# Patient Record
Sex: Female | Born: 2000 | Hispanic: No | Marital: Single | Smoking: Never smoker
Health system: Southern US, Community
[De-identification: ages and names within clinical notes are randomized; demographics above are authoritative.]

## PROBLEM LIST (undated history)

## (undated) DIAGNOSIS — G5603 Carpal tunnel syndrome, bilateral upper limbs: Secondary | ICD-10-CM

## (undated) DIAGNOSIS — J45909 Unspecified asthma, uncomplicated: Secondary | ICD-10-CM

## (undated) DIAGNOSIS — M93003 Unspecified slipped upper femoral epiphysis (nontraumatic), unspecified hip: Secondary | ICD-10-CM

## (undated) DIAGNOSIS — T7840XA Allergy, unspecified, initial encounter: Secondary | ICD-10-CM

## (undated) DIAGNOSIS — K219 Gastro-esophageal reflux disease without esophagitis: Secondary | ICD-10-CM

## (undated) DIAGNOSIS — E663 Overweight: Secondary | ICD-10-CM

## (undated) HISTORY — DX: Overweight: E66.3

## (undated) HISTORY — DX: Unspecified slipped upper femoral epiphysis (nontraumatic), unspecified hip: M93.003

## (undated) HISTORY — DX: Allergy, unspecified, initial encounter: T78.40XA

---
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
## 2018-01-13 ENCOUNTER — Encounter

## 2018-01-13 ENCOUNTER — Ambulatory Visit: Payer: Medicaid Other | Admitting: Diagnostic Neuroimaging

## 2018-01-14 ENCOUNTER — Encounter: Payer: Self-pay | Admitting: Diagnostic Neuroimaging

## 2018-03-02 ENCOUNTER — Other Ambulatory Visit: Payer: Self-pay | Admitting: Obstetrics

## 2018-03-02 DIAGNOSIS — Z3009 Encounter for other general counseling and advice on contraception: Secondary | ICD-10-CM

## 2018-03-03 ENCOUNTER — Other Ambulatory Visit: Payer: Self-pay | Admitting: Obstetrics

## 2018-03-03 DIAGNOSIS — Z30011 Encounter for initial prescription of contraceptive pills: Secondary | ICD-10-CM

## 2018-06-12 ENCOUNTER — Emergency Department (HOSPITAL_COMMUNITY)
Admission: EM | Admit: 2018-06-12 | Discharge: 2018-06-13 | Disposition: A | Discharge status: Home / Self Care | Payer: Medicaid Other | Attending: Emergency Medicine | Admitting: Emergency Medicine

## 2018-06-12 ENCOUNTER — Other Ambulatory Visit: Payer: Self-pay

## 2018-06-12 DIAGNOSIS — R200 Anesthesia of skin: Secondary | ICD-10-CM | POA: Insufficient documentation

## 2018-06-12 DIAGNOSIS — Z79899 Other long term (current) drug therapy: Secondary | ICD-10-CM | POA: Insufficient documentation

## 2018-06-12 DIAGNOSIS — R202 Paresthesia of skin: Secondary | ICD-10-CM

## 2018-06-12 DIAGNOSIS — R0789 Other chest pain: Secondary | ICD-10-CM | POA: Insufficient documentation

## 2018-06-12 NOTE — ED Provider Notes (Signed)
[facility]-EMERGENCY DEPT Provider Note   CSN: [id number] Arrival date & time: 06/12/18  1810     History   Chief Complaint Chief Complaint  Patient presents with  . Numbness  . Tingling  . Chest Pain    HPI Olivia Vasquez is a 17 y.o. female past medical history significant for paresthesias who presents for evaluation of abnormal sensation to her body.  Patient states she has had this sensation for over 1.5 years.  States she has been seen by neurology Dr. Marjory Lies with [facility] and received an MRI with negative results.  Patient states she did not follow-up with neurology for continued symptoms.  Seen by her primary care provider for this issue multiple times the past year without a cause of her symptoms.  Patient states she is frustrated that no one can tell her why she has these abnormal sensations. Denies fever, chills, headache, nausea, vomiting, chest pain, shortness of breath, abdominal pain, dysuria, numbness.  Denies any known trauma to head or neck.  Denies neck decreased range of motion or stiffness.  Patient states she had an episode of pain to her collar bone when she reach over her head this morning, which she calls "chest pain." Patient states that "Im here to get an MRI of my head and back for my issues." Patient was seen by [facility] yesterday with labs and imaging performed. Patient states "they told me they didn't find anything." Patient states she only feels the abnormal sensation when she touches her skin, not when providers do an exam.  HPI  Past Medical History:  Diagnosis Date  . Allergy   . Asthma   . Overweight child 11/12/2011   BMI 95%  . 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 GERD (gastroesophageal reflux disease) 12/12/2011  . Overweight child 11/12/2011  . Allergic rhinitis 05/22/2011  .  Asthma, mild intermittent 05/22/2011  . 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 SLIPPED CAPITAL FEMORAL EPIPHYSIS PINNING  06/2008   Pinned at [facility]     OB History   None      Home Medications    Prior to Admission medications   Medication Sig Start Date End Date Taking? Authorizing Provider  BLISOVI 24 FE 1-20 MG-MCG(24) tablet TAKE 1 TABLET BY MOUTH EVERY DAY 03/04/18  Yes Brock Bad, MD  ibuprofen (ADVIL,MOTRIN) 800 MG tablet Take 1 tablet (800 mg total) by mouth every 8 (eight) hours as needed for headache, mild pain or moderate pain. 05/02/17  Yes Brock Bad, MD  Prenat-FeCbn-FeAsp-Meth-FA-DHA (PRENATE MINI) 18-0.6-0.4-350 MG CAPS TAKE 1 CAPSULE BY MOUTH AT BEDTIME. 03/03/18  Yes Brock Bad, MD    Family History Family History  Problem Relation Age of Onset  . Diabetes Paternal Grandmother   . Hypertension Paternal Grandmother   . Kidney disease Maternal Uncle        cancer  . Kidney disease Maternal Grandmother        cancer  . Hypertension Maternal Grandmother   . Thyroid disease Maternal Grandmother   . Cancer Maternal Grandfather        fatty liver.    Social History Social History   Tobacco Use  . Smoking status: Never Smoker  . Smokeless tobacco: Never Used  Substance Use Topics  .  Alcohol use: No    Alcohol/week: 0.0 standard drinks  . Drug use: No     Allergies   Patient has no known allergies.   Review of Systems Review of Systems  Constitutional: Negative.   Respiratory: Negative.   Cardiovascular: Negative.   Gastrointestinal: Negative.   Genitourinary: Negative.   Musculoskeletal: Negative.  Negative for back pain, myalgias, neck pain and neck stiffness.  Skin: Negative.   Neurological: Negative for dizziness, tremors, syncope, facial asymmetry, speech difficulty, weakness, light-headedness and headaches.       "Abnormal sensation to entire body"  All other  systems reviewed and are negative.    Physical Exam Updated Vital Signs BP 134/76 (BP Location: Left Arm)   Pulse 69   Temp 97.9 F (36.6 C) (Oral)   Resp 19   Ht 5' 3.5" (1.613 m)   Wt 88.9 kg   LMP 05/24/2018   SpO2 98%   BMI 34.18 kg/m   Physical Exam  Physical Exam  Constitutional: Pt appears well-developed and well-nourished. No distress.  HENT:  Head: Normocephalic and atraumatic.  Mouth/Throat: Oropharynx is clear and moist. No oropharyngeal exudate.  Eyes: Conjunctivae are normal.  Neck: Normal range of motion. Neck supple.  Full ROM without pain.  No neck rigidity. Cardiovascular: Normal rate, regular rhythm and intact distal pulses.   Pulmonary/Chest: Effort normal and breath sounds normal. No respiratory distress. Pt has no wheezes.  Abdominal: Soft. Pt exhibits no distension. There is no tenderness, rebound or guarding. No abd bruit or pulsatile mass Musculoskeletal:  Full range of motion of the T-spine and L-spine with flexion, hyperextension, and lateral flexion. No midline tenderness or stepoffs. No tenderness to palpation of the spinous processes of the T-spine or L-spine. No tenderness to palpation of the paraspinous muscles of the L-spine. Lymphadenopathy:    Pt has no cervical adenopathy.  Neurological: Pt is alert. Pt has normal reflexes.  Reflex Scores:      Bicep reflexes are 2+ on the right side and 2+ on the left side.      Brachioradialis reflexes are 2+ on the right side and 2+ on the left side.      Patellar reflexes are 2+ on the right side and 2+ on the left side.      Achilles reflexes are 2+ on the right side and 2+ on the left side. Speech is clear and goal oriented, follows commands Normal 5/5 strength in upper and lower extremities bilaterally including dorsiflexion and plantar flexion, strong and equal grip strength Sensation normal to light and sharp touch. Patient states it only "feels weird when I touch my skin." Moves extremities  without ataxia, coordination intact Normal gait Normal balance No Clonus Skin: Skin is warm and dry. No rash noted or lesions noted. Pt is not diaphoretic. No erythema, ecchymosis,edema or warmth.  Psychiatric: Pt has a normal mood and affect. Behavior is normal.  Nursing note and vitals reviewed.  ED Treatments / Results  Labs (all labs ordered are listed, but only abnormal results are displayed) Labs Reviewed  CBC WITH DIFFERENTIAL/PLATELET - Abnormal; Notable for the following components:      Result Value   Lymphs Abs 0.5 (*)    All other components within normal limits  COMPREHENSIVE METABOLIC PANEL  MAGNESIUM    EKG EKG Interpretation  Date/Time:  Thursday June 12 2018 19:03:22 EDT Ventricular Rate:  81 PR Interval:    QRS Duration: 91 QT Interval:  356 QTC Calculation: 414 R Axis:  79 Text Interpretation:  Sinus rhythm Baseline wander in lead(s) V4 Confirmed by Tilden Fossa [phone]) on 06/13/2018 12:05:35 AM   Radiology No results found.  Procedures Procedures (including critical care time)  Medications Ordered in ED Medications - No data to display   Initial Impression / Assessment and Plan / ED Course  I have reviewed the triage vital signs and the nursing notes.  Pertinent labs & imaging results that were available during my care of the patient were reviewed by me and considered in my medical decision making (see chart for details).  17 year old female who appears otherwise well presents for evaluation of "weird sensation to my entire body." Has been occurring for over 1.5 years. Has been seen by Neurology and her PCP multiple times for this issue. Has a negative MRI brain in 2018 for this issue. Patient was supposed to follow up for nerve conduction studies, which she did not follow -up with.  Patient states initial triage exam that she has chest pain, however when discussing with patient she points to her right collarbone and states that she had pain  right here in her chest when she reached above her head earlier this morning. EKG normal sinus rhythm. Denies current pain.  There has been no trauma to this area.  Low suspicion for ACS, PERC negative.  Patient is adamant that " I need my from my head and my entire body for my issues."  Discussed with patient that she does not need an emergent MRI at this time.  Patient is upset stating that she has "already waited and I expect scans." I was not able to reproduce her paresthesia on exam. Discussed with patient we can obtain lab work to r/o electrolyte or metabolic abnormalities that would be causing her symptoms. Given her length of symptoms and multiple ED and outpatient visits with negative imaging for her recurrent symptoms doubt emergent pathology. Discussed follow-up with her Neurologist for her recurrent symptoms.  Care of patient was transferred to [facility], PA-C at shift change. She will determine ultimate disposition of patient.    Final Clinical Impressions(s) / ED Diagnoses   Final diagnoses:  None    ED Discharge Orders    None       Teyona Nichelson A, PA-C 06/13/18 0049    Tilden Fossa, MD 06/15/18 [phone]

## 2018-06-12 NOTE — ED Triage Notes (Addendum)
Patient arrives with c/o numbness and tingling over whole body. Patient states she has been by Neurology with no explanation. Patient reports tingling and numbness occurs when reaching above head. Patient reports chest tightness. Patient seen at [facility] medical and had xrays and blood taken but was unable to get results.

## 2018-06-13 LAB — CBC WITH DIFFERENTIAL/PLATELET
Abs Immature Granulocytes: 0.01 10*3/uL (ref 0.00–0.07)
BASOS PCT: 0 %
Basophils Absolute: 0 10*3/uL (ref 0.0–0.1)
EOS ABS: 0.1 10*3/uL (ref 0.0–0.5)
Eosinophils Relative: 1 %
HCT: 42.1 % (ref 36.0–46.0)
Hemoglobin: 13.4 g/dL (ref 12.0–15.0)
IMMATURE GRANULOCYTES: 0 %
Lymphocytes Relative: 7 %
Lymphs Abs: 0.5 10*3/uL — ABNORMAL LOW (ref 0.7–4.0)
MCH: 30.6 pg (ref 26.0–34.0)
MCHC: 31.8 g/dL (ref 30.0–36.0)
MCV: 96.1 fL (ref 80.0–100.0)
MONO ABS: 0.8 10*3/uL (ref 0.1–1.0)
Monocytes Relative: 11 %
NEUTROS PCT: 81 %
Neutro Abs: 6 10*3/uL (ref 1.7–7.7)
PLATELETS: 337 10*3/uL (ref 150–400)
RBC: 4.38 MIL/uL (ref 3.87–5.11)
RDW: 12.7 % (ref 11.5–15.5)
WBC: 7.5 10*3/uL (ref 4.0–10.5)
nRBC: 0 % (ref 0.0–0.2)

## 2018-06-13 LAB — COMPREHENSIVE METABOLIC PANEL
ALT: 16 U/L (ref 0–44)
AST: 16 U/L (ref 15–41)
Albumin: 3.7 g/dL (ref 3.5–5.0)
Alkaline Phosphatase: 64 U/L (ref 38–126)
Anion gap: 8 (ref 5–15)
BUN: 11 mg/dL (ref 6–20)
CHLORIDE: 107 mmol/L (ref 98–111)
CO2: 25 mmol/L (ref 22–32)
CREATININE: 0.74 mg/dL (ref 0.44–1.00)
Calcium: 9.3 mg/dL (ref 8.9–10.3)
GFR calc Af Amer: >60 mL/min (ref >60)
GFR calc non Af Amer: >60 mL/min (ref >60)
Glucose, Bld: 86 mg/dL (ref 70–99)
POTASSIUM: 3.5 mmol/L (ref 3.5–5.1)
Sodium: 140 mmol/L (ref 135–145)
Total Bilirubin: 0.1 mg/dL — ABNORMAL LOW (ref 0.3–1.2)
Total Protein: 7.5 g/dL (ref 6.5–8.1)

## 2018-06-13 LAB — MAGNESIUM: Magnesium: 2.2 mg/dL (ref 1.7–2.4)

## 2018-06-13 NOTE — ED Provider Notes (Signed)
Patient signed out to me by the [place], PA-C.  Please see previous notes for further history.  In brief, patient presenting for evaluation of 1-1/2-year history of abnormal sensations to the body.  She has been seen by both neurology and orthopedics for this.  She is here today requesting an MRI.  No neurologic deficits noted on exam.  Plan to obtain basic labs and have patient follow-up with [facility] neurology if labs are normal.  Labs reassuring, no leukocytosis.  Electrolytes stable.  Hemoglobin stable.  Kidney, liver function normal.  Discussed findings with patient.  Discussed follow-up with neurology for further evaluation.  At this time, patient appears safe for discharge.  Return precautions given.  Patient states she understands.    Olivia Apley, PA-C 06/13/18 Danae Orleans, MD 06/13/18 [phone]

## 2018-06-13 NOTE — Discharge Instructions (Addendum)
Follow-up with your neurologist about your symptoms. Return to the emergency room with any new, worsening, or concerning symptoms.

## 2018-06-16 ENCOUNTER — Encounter: Payer: Self-pay | Admitting: Diagnostic Neuroimaging

## 2018-06-16 ENCOUNTER — Ambulatory Visit: Payer: Medicaid Other | Admitting: Diagnostic Neuroimaging

## 2018-06-16 VITALS — BP 127/71 | HR 78 | Ht 63.5 in | Wt 207.0 lb

## 2018-06-16 DIAGNOSIS — R200 Anesthesia of skin: Secondary | ICD-10-CM

## 2018-06-16 DIAGNOSIS — M542 Cervicalgia: Secondary | ICD-10-CM

## 2018-06-16 DIAGNOSIS — R202 Paresthesia of skin: Secondary | ICD-10-CM | POA: Diagnosis not present

## 2018-06-16 NOTE — Patient Instructions (Signed)
-   check MRI cervical spine   - check EMG/NCS (electrical nerve testing)  - referral placed for PT / OT

## 2018-06-16 NOTE — Progress Notes (Signed)
[facility]  PATIENT: Olivia Vasquez DOB: 20-Nov-2000  REFERRING CLINICIAN: S Gosrani HISTORY FROM: patient  REASON FOR VISIT: follow up   HISTORICAL  CHIEF COMPLAINT:  Chief Complaint  Patient presents with  . Numbness    rm 7, friend- DJ, "saw PCP, Dr Malen Gauze last Wed, had labs and xray of neck, spine done- didn't receive results"  . Follow-up    HISTORY OF PRESENT ILLNESS:   UPDATE (06/16/18, VRP): Since last visit, doing about the same. Symptoms are worsening, with movements such as hands out-stretch and holding backpack. Severity is mild-mdoerate. No alleviating or aggravating factors.    PRIOR HPI (03/26/17): 17 year old female here for evaluation of numbness and tingling. January 2018 patient 2 weeks episode of numbness and tingling in her hands and feet. This spontaneous resolved. In March 2018 patient had similar episode of numbness in her hands and feet. This lasts for 1 week. In June 2018 patient had recurrence of similar symptoms. Since that time patient having migratory transient numbness and tingling in her hands, arms, feet and legs. She denies any significant symptoms in her chest or abdomen. No problems with her face. No vision changes, dizziness, slurred speech.  No specific triggering or aggravating factors. No change in sleep, stress, diet or exercise. No recent traumas or accidents.   REVIEW OF SYSTEMS: Full 14 system review of systems performed and negative with exception of: numbness weakness SOB runny nose.  ALLERGIES: No Known Allergies  HOME MEDICATIONS: Outpatient Medications Prior to Visit  Medication Sig Dispense Refill  . BLISOVI 24 FE 1-20 MG-MCG(24) tablet TAKE 1 TABLET BY MOUTH EVERY DAY 28 tablet 11  . ibuprofen (ADVIL,MOTRIN) 800 MG tablet Take 1 tablet (800 mg total) by mouth every 8 (eight) hours as needed for headache, mild pain or moderate pain. 30 tablet 6  . Prenat-FeCbn-FeAsp-Meth-FA-DHA (PRENATE MINI)  18-0.6-0.4-350 MG CAPS TAKE 1 CAPSULE BY MOUTH AT BEDTIME. 30 capsule 11   No facility-administered medications prior to visit.     PAST MEDICAL HISTORY: Past Medical History:  Diagnosis Date  . Allergy   . Asthma   . Overweight child 11/12/2011   BMI 95%  . 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 SLIPPED CAPITAL FEMORAL EPIPHYSIS PINNING  06/2008   Pinned at [facility]    FAMILY HISTORY: Family History  Problem Relation Age of Onset  . Diabetes Paternal Grandmother   . Hypertension Paternal Grandmother   . Kidney disease Maternal Uncle        cancer  . Kidney disease Maternal Grandmother        cancer  . Hypertension Maternal Grandmother   . Thyroid disease Maternal Grandmother   . Cancer Maternal Grandfather        fatty liver.    SOCIAL HISTORY:  Social History   Socioeconomic History  . Marital status: Single    Spouse name: Not on file  . Number of children: Not on file  . Years of education: Not on file  . Highest education level: Not on file  Occupational History  . Not on file  Social Needs  . Financial resource strain: Not on file  . Food insecurity:    Worry: Not on file    Inability: Not on file  . Transportation needs:    Medical: Not on file    Non-medical: Not on  file  Tobacco Use  . Smoking status: Never Smoker  . Smokeless tobacco: Never Used  Substance and Sexual Activity  . Alcohol use: No    Alcohol/week: 0.0 standard drinks  . Drug use: No  . Sexual activity: Never  Lifestyle  . Physical activity:    Days per week: Not on file    Minutes per session: Not on file  . Stress: Not on file  Relationships  . Social connections:    Talks on phone: Not on file    Gets together: Not on file    Attends religious service: Not on file    Active member of club or organization: Not on file    Attends  meetings of clubs or organizations: Not on file    Relationship status: Not on file  . Intimate partner violence:    Fear of current or ex partner: Not on file    Emotionally abused: Not on file    Physically abused: Not on file    Forced sexual activity: Not on file  Other Topics Concern  . Not on file  Social History Narrative   Lives at home with grandmother. .  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       PHYSICAL EXAM  GENERAL EXAM/CONSTITUTIONAL: Vitals:  Vitals:   06/16/18 1518  BP: 127/71  Pulse: 78  Weight: 207 lb (93.9 kg)  Height: 5' 3.5" (1.613 m)   Body mass index is 36.09 kg/m. No exam data present  Patient is in no distress; well developed, nourished and groomed; neck is supple  CARDIOVASCULAR:  Examination of carotid arteries is normal; no carotid bruits  Regular rate and rhythm, no murmurs  Examination of peripheral vascular system by observation and palpation is normal  EYES:  Ophthalmoscopic exam of optic discs and posterior segments is normal; no papilledema or hemorrhages  MUSCULOSKELETAL:  Gait, strength, tone, movements noted in Neurologic exam below  NEUROLOGIC: MENTAL STATUS:  No flowsheet data found.  awake, alert, oriented to person, place and time  recent and remote memory intact  normal attention and concentration  language fluent, comprehension intact, naming intact,   fund of knowledge appropriate  CRANIAL NERVE:   2nd - no papilledema on fundoscopic exam  2nd, 3rd, 4th, 6th - pupils equal and reactive to light, visual fields full to confrontation, extraocular muscles intact, no nystagmus  5th - facial sensation symmetric  7th - facial strength symmetric  8th - hearing intact  9th - palate elevates symmetrically, uvula midline  11th - shoulder shrug symmetric  12th - tongue protrusion midline  MOTOR:   normal bulk and tone, full strength in the BUE, BLE  SENSORY:   normal and symmetric to light touch,  temperature, vibration  COORDINATION:   finger-nose-finger, fine finger movements normal  REFLEXES:   deep tendon reflexes present and symmetric  GAIT/STATION:   narrow based gait    DIAGNOSTIC DATA (LABS, IMAGING, TESTING) - I reviewed patient records, labs, notes, testing and imaging myself where available.  Lab Results  Component Value Date   WBC 7.5 06/12/2018   HGB 13.4 06/12/2018   HCT 42.1 06/12/2018   MCV 96.1 06/12/2018   PLT 337 06/12/2018      Component Value Date/Time   NA 140 06/12/2018 2310   K 3.5 06/12/2018 2310   CL 107 06/12/2018 2310   CO2 25 06/12/2018 2310   GLUCOSE 86 06/12/2018 2310   BUN 11 06/12/2018 2310   CREATININE 0.74 06/12/2018 2310  CREATININE 0.54 09/03/2011 1446   CALCIUM 9.3 06/12/2018 2310   PROT 7.5 06/12/2018 2310   ALBUMIN 3.7 06/12/2018 2310   AST 16 06/12/2018 2310   ALT 16 06/12/2018 2310   ALKPHOS 64 06/12/2018 2310   BILITOT 0.1 (L) 06/12/2018 2310   GFRNONAA >60 06/12/2018 2310   GFRAA >60 06/12/2018 2310   No results found for: CHOL, HDL, LDLCALC, LDLDIRECT, TRIG, CHOLHDL Lab Results  Component Value Date   HGBA1C 5.5 09/03/2011   Lab Results  Component Value Date   VITAMINB12 515 03/26/2017   Lab Results  Component Value Date   TSH 2.247 09/03/2011    02/12/03 CT head  - NO EVIDENCE OF INTRACRANIAL MASS OR HEMORRHAGE.  NO SKULL FRACTURE OR FOREIGN BODY.  04/05/17 MRI brain [I reviewed images myself and agree with interpretation. -VRP]  1.   The brain parenchyma appears normal. 2.   The sella turcica is mildly enlarged but the pituitary has a normal contour.   Since the pituitary tissue has normal height, this is unlikely to be clinically significant though early changes of elevated intracranial pressure cannot be ruled out.    ASSESSMENT AND PLAN  17 y.o. year old female here with transient numbness and tingling in arms and legs. Symptoms are significantly bothering patient. Symptoms lasting up to 2  weeks at a time.   Ddx transient numbness and tingling: peripheral neuropathy, intermittent nerve compression, benign paresthesias  1. Hand numbness   2. Cervicalgia   3. Numbness and tingling      PLAN:  - check MRI cervical spine to eval hand /foot numbness (eval for demyelinating dz) - check EMG/NCS (eval for neuropathy; CTS) - referral placed for PT / OT  Orders Placed This Encounter  Procedures  . MR CERVICAL SPINE W WO CONTRAST  . Ambulatory referral to Physical Therapy  . Ambulatory referral to Occupational Therapy  . NCV with EMG(electromyography)   Return for for NCV/EMG.    Suanne Marker, MD 06/16/2018, 3:26 PM Certified in Neurology, Neurophysiology and Neuroimaging  [facility] [address] [place], [place] [address] [phone]

## 2018-06-17 ENCOUNTER — Telehealth: Payer: Self-pay | Admitting: Diagnostic Neuroimaging

## 2018-06-17 NOTE — Telephone Encounter (Signed)
Spoke to patient she is aware of this and to call [facility]  If she has not heard in the next 2-3 business days. She stated she has their number.

## 2018-06-17 NOTE — Telephone Encounter (Signed)
Medicaid order sent to [facility]. They obtain the auth and will reach out to the pt to schedule.  °

## 2018-07-31 ENCOUNTER — Encounter

## 2018-07-31 ENCOUNTER — Ambulatory Visit (INDEPENDENT_AMBULATORY_CARE_PROVIDER_SITE_OTHER): Payer: Medicaid Other | Admitting: Diagnostic Neuroimaging

## 2018-07-31 ENCOUNTER — Encounter (INDEPENDENT_AMBULATORY_CARE_PROVIDER_SITE_OTHER): Payer: Medicaid Other | Admitting: Diagnostic Neuroimaging

## 2018-07-31 DIAGNOSIS — Z0289 Encounter for other administrative examinations: Secondary | ICD-10-CM

## 2018-07-31 DIAGNOSIS — R200 Anesthesia of skin: Secondary | ICD-10-CM | POA: Diagnosis not present

## 2018-08-04 ENCOUNTER — Other Ambulatory Visit: Payer: Self-pay | Admitting: Obstetrics

## 2018-08-04 DIAGNOSIS — N946 Dysmenorrhea, unspecified: Secondary | ICD-10-CM

## 2018-08-05 NOTE — Procedures (Signed)
[facility]  NCS (NERVE CONDUCTION STUDY) WITH EMG (ELECTROMYOGRAPHY) REPORT   STUDY DATE: 07/31/18 PATIENT NAME: Olivia Vasquez DOB: 18-May-2001 MRN: [id number]  ORDERING CLINICIAN: Joycelyn Schmid, MD   TECHNOLOGIST: Charlesetta Ivory ELECTROMYOGRAPHER: Glenford Bayley. Penumalli, MD  CLINICAL INFORMATION: 17 year old female with hand numbness.  FINDINGS: NERVE CONDUCTION STUDY: Bilateral median, right ulnar, right peroneal and right tibial motor responses are normal.  Bilateral median to ulnar transcarpal comparison studies show prolonged peak latency differences.   Right median sensory response is prolonged peak latency and decreased amplitude.  Median sensory response has prolonged peak latency and normal amplitude.  Bilateral ulnar sensory responses normal.   NEEDLE ELECTROMYOGRAPHY:  Right deltoid, biceps, triceps, flexor carpi radialis, first dorsal interosseous muscles are normal.    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    INTERPRETING PHYSICIAN:  Suanne Marker, MD Certified in Neurology, Neurophysiology and Neuroimaging  [facility] [address], [address] [place], [place] [address] [phone]   [facility]    Nerve / Sites Muscle Latency Ref. Amplitude Ref. Rel Amp Segments Distance Velocity Ref. Area    ms ms mV mV %  cm m/s m/s mVms  R Median - APB     Wrist APB 4.1 ?4.4 8.4 ?4.0 100 Wrist - APB 7   31.2     Upper arm APB 7.9  8.3  99.1 Upper arm - Wrist 22 58 ?49 30.3  L Median - APB     Wrist APB 4.0 ?4.4 8.6 ?4.0 100 Wrist - APB 7   28.1     Upper arm APB 7.6  8.1  94.8 Upper arm - Wrist 22 61 ?49 27.0  R Ulnar - ADM     Wrist ADM 2.6 ?3.3 9.2 ?6.0 100 Wrist - ADM 7   22.4     B.Elbow ADM 5.2  8.7  94.8 B.Elbow - Wrist 18 69 ?49 22.4     A.Elbow ADM 6.7  8.6  99.1 A.Elbow - B.Elbow 10 66 ?49 22.5   A.Elbow - Wrist      R Peroneal - EDB     Ankle EDB 4.2 ?6.5 7.3 ?2.0 100 Ankle - EDB 9   21.8     Fib head EDB 10.0  6.7  91.8 Fib head - Ankle 32 55 ?44 20.2     Pop fossa EDB 11.9  5.9  88.2 Pop fossa - Fib head 10 53 ?44 17.7         Pop fossa - Ankle      R Tibial - AH     Ankle AH 3.8 ?5.8 12.8 ?4.0 100 Ankle - AH 9   22.7     Pop fossa AH 11.5  11.1  86.1 Pop fossa - Ankle 36 46 ?41 22.2               SNC    Nerve / Sites Rec. Site Peak Lat Ref.  Amp Ref. Segments Distance Peak Diff Ref.    ms ms V V  cm ms ms  R Sural - Ankle (Calf)     Calf Ankle 3.9 ?4.4 8 ?6 Calf - Ankle 14    R Superficial peroneal - Ankle     Lat leg Ankle 3.4 ?4.4 13 ?6 Lat leg - Ankle 14    R Median, Ulnar - Transcarpal comparison     Median Palm Wrist  3.0 ?2.2 14 ?35 Median Palm - Wrist 8       Ulnar Palm Wrist 1.9 ?2.2 24 ?12 Ulnar Palm - Wrist 8          Median Palm - Ulnar Palm  1.1 ?0.4  L Median, Ulnar - Transcarpal comparison     Median Palm Wrist 2.7 ?2.2 49 ?35 Median Palm - Wrist 8       Ulnar Palm Wrist 1.6 ?2.2 41 ?12 Ulnar Palm - Wrist 8          Median Palm - Ulnar Palm  1.0 ?0.4  R Median - Orthodromic (Dig II, Mid palm)     Dig II Wrist 3.8 ?3.4 8 ?10 Dig II - Wrist 13    L Median - Orthodromic (Dig II, Mid palm)     Dig II Wrist 4.2 ?3.4 10 ?10 Dig II - Wrist 13    R Ulnar - Orthodromic, (Dig V, Mid palm)     Dig V Wrist 2.4 ?3.1 10 ?5 Dig V - Wrist 11    L Ulnar - Orthodromic, (Dig V, Mid palm)     Dig V Wrist 2.4 ?3.1 17 ?5 Dig V - Wrist 5211                       F  Wave    Nerve F Lat Ref.   ms ms  R Ulnar - ADM 25.2 ?32.0  R Tibial - AH 47.4 ?56.0         EMG full       EMG Summary Table    Spontaneous MUAP Recruitment  Muscle IA Fib PSW Fasc Other Amp Dur. Poly Pattern  R. Deltoid Normal None None None _______ Normal Normal Normal Normal  R. Biceps brachii Normal None None None _______ Normal Normal Normal Normal  R. Triceps brachii Normal None None None _______  Normal Normal Normal Normal  R. Flexor carpi radialis Normal None None None _______ Normal Normal Normal Normal  R. First dorsal interosseous Normal None None None _______ Normal Normal Normal Normal

## 2018-11-23 ENCOUNTER — Other Ambulatory Visit: Payer: Self-pay | Admitting: Obstetrics

## 2018-11-23 DIAGNOSIS — Z30011 Encounter for initial prescription of contraceptive pills: Secondary | ICD-10-CM

## 2019-03-03 ENCOUNTER — Ambulatory Visit (INDEPENDENT_AMBULATORY_CARE_PROVIDER_SITE_OTHER): Payer: Medicaid Other | Admitting: Orthopaedic Surgery

## 2019-03-03 ENCOUNTER — Other Ambulatory Visit: Payer: Self-pay

## 2019-03-03 ENCOUNTER — Encounter: Payer: Self-pay | Admitting: Orthopaedic Surgery

## 2019-03-03 DIAGNOSIS — G5603 Carpal tunnel syndrome, bilateral upper limbs: Secondary | ICD-10-CM

## 2019-03-03 NOTE — Progress Notes (Signed)
Office Visit Note   Patient: Olivia Vasquez           Date of Birth: 03-05-2001           MRN: [id number] Visit Date: 03/03/2019              Requested by: [facility], [facility] [address] [place] [id number] PCP: [facility]   Assessment & Plan: Visit Diagnoses:  1. Bilateral carpal tunnel syndrome     Plan: Impression is mild bilateral carpal tunnel syndrome right greater than left.  We will inject the right, more symptomatic in today.  We will also provide her with bilateral removable wrist splints.  She will follow-up with us as needed.  Follow-Up Instructions: Return if symptoms worsen or fail to improve.   Orders:  Orders Placed This Encounter  Procedures   Hand/UE Inj: R carpal tunnel   No orders of the defined types were placed in this encounter.     Procedures: Hand/UE Inj: R carpal tunnel for carpal tunnel syndrome on 03/03/2019 3:26 PM Indications: pain Details: 25 G needle, volar approach      Clinical Data: No additional findings.   Subjective: Chief Complaint  Patient presents with   Right Hand - Pain   Left Hand - Pain    HPI patient is a pleasant 18 year old right-hand-dominant female who comes in today with bilateral hand numbness.  This has been ongoing for the past few years.  She was initially seen in the ED for complete body numbness.  She has had a complete work-up by neurologist according to the patient which showed only mild carpal tunnel syndrome.  She also notes that she had low potassium and once this was stabilized her symptoms started to improve.  Nerve conduction study of bilateral upper extremity showed mild median nerve entrapment.  She is having intermittent numbness to all 5 fingers.  This is aggravated when she is reaching up in her closet to get a heavy pair tennis shoes or when she is sleeping at night.  She does not wear wrist splints.  No previous cortisone injection to either wrist.  She does  take gabapentin which does not seem to help her hands.  Review of Systems as detailed in HPI.  All others reviewed and are negative.   Objective: Vital Signs: There were no vitals taken for this visit.  Physical Exam well-developed well-nourished female no acute distress.  Alert and oriented x3.  Ortho Exam examination of both hands reveals positive Phalen positive Tinel right greater than left.  No thenar atrophy.  Full sensation distally.  Specialty Comments:  No specialty comments available.  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    Past Surgical History:  Procedure Laterality Date   SLIPPED CAPITAL FEMORAL EPIPHYSIS PINNING  06/2008   Pinned at [place] History   Occupational History   Not on file  Tobacco Use   Smoking status: Never Smoker   Smokeless tobacco: Never Used  Substance and Sexual Activity   Alcohol use: No    Alcohol/week: 0.0 standard drinks   Drug use: No   Sexual activity: Never

## 2019-04-25 ENCOUNTER — Other Ambulatory Visit: Payer: Self-pay | Admitting: Obstetrics

## 2019-04-25 DIAGNOSIS — Z3009 Encounter for other general counseling and advice on contraception: Secondary | ICD-10-CM

## 2019-08-06 ENCOUNTER — Other Ambulatory Visit: Payer: Self-pay

## 2019-08-06 ENCOUNTER — Ambulatory Visit (INDEPENDENT_AMBULATORY_CARE_PROVIDER_SITE_OTHER): Payer: Medicaid Other | Admitting: Orthopaedic Surgery

## 2019-08-06 DIAGNOSIS — G5601 Carpal tunnel syndrome, right upper limb: Secondary | ICD-10-CM | POA: Diagnosis not present

## 2019-08-06 MED ORDER — BUPIVACAINE HCL 0.5 % IJ SOLN
1.0000 mL | INTRAMUSCULAR | Status: AC | PRN
  Administered 2019-08-06: 09:00:00 1 mL

## 2019-08-06 MED ORDER — METHYLPREDNISOLONE ACETATE 40 MG/ML IJ SUSP
40.0000 mg | INTRAMUSCULAR | Status: AC | PRN
  Administered 2019-08-06: 09:00:00 40 mg

## 2019-08-06 MED ORDER — LIDOCAINE HCL 1 % IJ SOLN
1.0000 mL | INTRAMUSCULAR | Status: AC | PRN
  Administered 2019-08-06: 09:00:00 1 mL

## 2019-08-06 NOTE — Progress Notes (Signed)
Office Visit Note   Patient: Olivia Vasquez           Date of Birth: Apr 22, 2001           MRN: [id number] Visit Date: 08/06/2019              Requested by: Center, [facility] [address] [place],  [place] [address] PCP: [place]: Visit Diagnoses:  1. Right carpal tunnel syndrome     Plan: Impression is right carpal tunnel syndrome.  Patient had good relief from injection in July but the effects have worn off.  After another discussion on various treatment options including repeat injection versus surgical release patient has elected to have another injection today in the right hand.  Questions encouraged and answered.  Follow-up as needed.  Follow-Up Instructions: Return if symptoms worsen or fail to improve.   Orders:  No orders of the defined types were placed in this encounter.  No orders of the defined types were placed in this encounter.     Procedures: Hand/UE Inj: R carpal tunnel for carpal tunnel syndrome on 08/06/2019 9:13 AM Indications: pain Details: 25 G needle Medications: 1 mL lidocaine 1 %; 1 mL bupivacaine 0.5 %; 40 mg methylPREDNISolone acetate 40 MG/ML Outcome: tolerated well, no immediate complications Patient was prepped and draped in the usual sterile fashion.       Clinical Data: No additional findings.   Subjective: Chief Complaint  Patient presents with  . Right Wrist - Pain  . Left Wrist - Pain    Olivia Vasquez returns today for follow-up of her right carpal tunnel syndrome.  She had an injection in July which has helped a lot unfortunately has worn off.  She is here to discuss further her treatment options.  Her carpal tunnel braces made her symptoms worse so she has stopped wearing them.   Review of Systems   Objective: Vital Signs: There were no vitals taken for this visit.  Physical Exam  Ortho Exam Right hand exam is unchanged. Specialty Comments:  No specialty comments available.   Imaging: No results found.   PMFS History: Patient Active Problem List   Diagnosis Date Noted  . GERD (gastroesophageal reflux disease) 12/12/2011  . Overweight child 11/12/2011  . Allergic rhinitis 05/22/2011  . Asthma, mild intermittent 05/22/2011  . 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 Allergy   . Asthma   . Overweight child 11/12/2011   BMI 95%  . 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 Diabetes Paternal Grandmother   . Hypertension Paternal Grandmother   . Kidney disease Maternal Uncle        cancer  . Kidney disease Maternal Grandmother        cancer  . Hypertension Maternal Grandmother   . Thyroid disease Maternal Grandmother   . Cancer Maternal Grandfather        fatty liver.    Past Surgical History:  Procedure Laterality Date  . SLIPPED CAPITAL FEMORAL EPIPHYSIS PINNING  06/2008   Pinned at [place] History   Occupational History  . Not on file  Tobacco Use  . Smoking status: Never Smoker  . Smokeless tobacco: Never Used  Substance and Sexual Activity  . Alcohol use: No    Alcohol/week: 0.0 standard  drinks  . Drug use: No  . Sexual activity: Never

## 2019-12-01 ENCOUNTER — Other Ambulatory Visit: Payer: Self-pay | Admitting: Obstetrics

## 2019-12-01 DIAGNOSIS — Z30011 Encounter for initial prescription of contraceptive pills: Secondary | ICD-10-CM

## 2020-05-19 ENCOUNTER — Ambulatory Visit (INDEPENDENT_AMBULATORY_CARE_PROVIDER_SITE_OTHER): Payer: Medicaid Other | Admitting: Orthopaedic Surgery

## 2020-05-19 ENCOUNTER — Encounter: Payer: Self-pay | Admitting: Orthopaedic Surgery

## 2020-05-19 VITALS — Ht 64.0 in | Wt 200.0 lb

## 2020-05-19 DIAGNOSIS — G5601 Carpal tunnel syndrome, right upper limb: Secondary | ICD-10-CM

## 2020-05-19 DIAGNOSIS — G5602 Carpal tunnel syndrome, left upper limb: Secondary | ICD-10-CM

## 2020-05-19 MED ORDER — LIDOCAINE HCL 1 % IJ SOLN
1.0000 mL | INTRAMUSCULAR | Status: AC | PRN
  Administered 2020-05-19: 1 mL

## 2020-05-19 MED ORDER — BUPIVACAINE HCL 0.5 % IJ SOLN
1.0000 mL | INTRAMUSCULAR | Status: AC | PRN
  Administered 2020-05-19: 1 mL

## 2020-05-19 MED ORDER — METHYLPREDNISOLONE ACETATE 40 MG/ML IJ SUSP
40.0000 mg | INTRAMUSCULAR | Status: AC | PRN
  Administered 2020-05-19: 40 mg

## 2020-05-19 NOTE — Progress Notes (Signed)
Office Visit Note   Patient: Olivia Vasquez           Date of Birth: 2001-06-11           MRN: [id number] Visit Date: 05/19/2020              Requested by: Center, [facility] [address] [place],  [place] [address] PCP: Center, [facility]   Assessment & Plan: Visit Diagnoses:  1. Right carpal tunnel syndrome   2. Left carpal tunnel syndrome     Plan: Right carpal tunnel injection performed today.  Carpal tunnel braces were given for both sides.  Follow-up as needed.  Follow-Up Instructions: Return if symptoms worsen or fail to improve.   Orders:  No orders of the defined types were placed in this encounter.  No orders of the defined types were placed in this encounter.     Procedures: Hand/UE Inj: R carpal tunnel for carpal tunnel syndrome on 05/19/2020 8:59 AM Indications: pain Details: 25 G needle Medications: 1 mL lidocaine 1 %; 1 mL bupivacaine 0.5 %; 40 mg methylPREDNISolone acetate 40 MG/ML Outcome: tolerated well, no immediate complications Patient was prepped and draped in the usual sterile fashion.       Clinical Data: No additional findings.   Subjective: Chief Complaint  Patient presents with  . Right Wrist - Pain    Olivia Vasquez returns today for follow-up of bilateral carpal tunnel syndrome worse on the right.  Requesting another injection.  Previous right carpal tunnel injection gave her quite a bit of relief until just recently.  This injection was done in December of last year.  Denies any changes in medical history.  She has lost her carpal tunnel braces requesting new ones today.   Review of Systems   Objective: Vital Signs: Ht 5\' 4"  (1.626 m)   Wt 200 lb (90.7 kg)   BMI 34.33 kg/m   Physical Exam  Ortho Exam Bilateral hand exams are unchanged. Specialty Comments:  No specialty comments available.  Imaging: No results found.   PMFS History: Patient Active Problem List   Diagnosis Date Noted  . GERD  (gastroesophageal reflux disease) 12/12/2011  . Overweight child 11/12/2011  . Allergic rhinitis 05/22/2011  . Asthma, mild intermittent 05/22/2011  . 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 Allergy   . Asthma   . Overweight child 11/12/2011   BMI 95%  . 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 Diabetes Paternal Grandmother   . Hypertension Paternal Grandmother   . Kidney disease Maternal Uncle        cancer  . Kidney disease Maternal Grandmother        cancer  . Hypertension Maternal Grandmother   . Thyroid disease Maternal Grandmother   . Cancer Maternal Grandfather        fatty liver.    Past Surgical History:  Procedure Laterality Date  . SLIPPED CAPITAL FEMORAL EPIPHYSIS PINNING  06/2008   Pinned at [facility]   Social History   Occupational History  . Not on file  Tobacco Use  . Smoking status: Never Smoker  . Smokeless tobacco: Never Used  Substance and Sexual Activity  . Alcohol use: No    Alcohol/week: 0.0 standard drinks  . Drug use: No  . Sexual activity: Never

## 2020-11-06 ENCOUNTER — Other Ambulatory Visit: Payer: Self-pay | Admitting: Obstetrics

## 2020-11-06 DIAGNOSIS — Z30011 Encounter for initial prescription of contraceptive pills: Secondary | ICD-10-CM

## 2021-05-31 ENCOUNTER — Ambulatory Visit: Payer: Medicaid Other | Admitting: Orthopaedic Surgery

## 2021-05-31 ENCOUNTER — Other Ambulatory Visit: Payer: Self-pay

## 2021-05-31 ENCOUNTER — Encounter: Payer: Self-pay | Admitting: Orthopaedic Surgery

## 2021-05-31 DIAGNOSIS — G5601 Carpal tunnel syndrome, right upper limb: Secondary | ICD-10-CM

## 2021-05-31 DIAGNOSIS — G5603 Carpal tunnel syndrome, bilateral upper limbs: Secondary | ICD-10-CM

## 2021-05-31 DIAGNOSIS — G5602 Carpal tunnel syndrome, left upper limb: Secondary | ICD-10-CM

## 2021-05-31 MED ORDER — METHYLPREDNISOLONE ACETATE 40 MG/ML IJ SUSP
40.0000 mg | INTRAMUSCULAR | Status: AC | PRN
  Administered 2021-05-31: 40 mg

## 2021-05-31 MED ORDER — LIDOCAINE HCL 1 % IJ SOLN
1.0000 mL | INTRAMUSCULAR | Status: AC | PRN
  Administered 2021-05-31: 1 mL

## 2021-05-31 MED ORDER — BUPIVACAINE HCL 0.5 % IJ SOLN
1.0000 mL | INTRAMUSCULAR | Status: AC | PRN
  Administered 2021-05-31: 1 mL

## 2021-05-31 NOTE — Progress Notes (Signed)
Office Visit Note   Patient: Olivia Vasquez           Date of Birth: 2001-01-25           MRN: [id number] Visit Date: 05/31/2021              Requested by: Center, [facility] [address] [place],  [place] [address] PCP: Center, [facility]   Assessment & Plan: Visit Diagnoses:  1. Right carpal tunnel syndrome   2. Left carpal tunnel syndrome     Plan: Impression is bilateral carpal tunnel syndrome mild.  She had great relief from prior injections.  We repeated these injections today.  We also provided her with braces to wear at nighttime.  Questions encouraged and answered.  Follow-up as needed.  Follow-Up Instructions: Return if symptoms worsen or fail to improve.   Orders:  No orders of the defined types were placed in this encounter.  No orders of the defined types were placed in this encounter.     Procedures: Hand/UE Inj: bilateral carpal tunnel for carpal tunnel syndrome on 05/31/2021 11:17 AM Indications: pain Details: 25 G needle Medications (Right): 1 mL lidocaine 1 %; 1 mL bupivacaine 0.5 %; 40 mg methylPREDNISolone acetate 40 MG/ML Medications (Left): 1 mL lidocaine 1 %; 1 mL bupivacaine 0.5 %; 40 mg methylPREDNISolone acetate 40 MG/ML Outcome: tolerated well, no immediate complications Patient was prepped and draped in the usual sterile fashion.      Clinical Data: No additional findings.   Subjective: Chief Complaint  Patient presents with   Left Hand - Pain   Right Hand - Pain    Olivia Vasquez comes back today for follow-up of bilateral carpal tunnel syndrome.  She has had nerve conduction studies at neurology which showed mild bilateral carpal tunnel syndrome.  We saw her a little over a year ago and gave her carpal tunnel injections which greatly improved her symptoms until just recently.  She is requesting repeating these injections.  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   Review of  Systems   Objective: Vital Signs: There were no vitals taken for this visit.  Physical Exam  Ortho Exam  Bilateral hands show no muscle atrophy or focal motor deficits.  Exam is unchanged.  Specialty Comments:  No specialty comments available.  Imaging: No results found.   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    Past Surgical History:  Procedure Laterality Date   SLIPPED CAPITAL FEMORAL EPIPHYSIS PINNING  06/2008   Pinned at [facility]   Social History  Occupational History   Not on file  Tobacco Use   Smoking status: Never   Smokeless tobacco: Never  Substance and Sexual Activity   Alcohol use: No    Alcohol/week: 0.0 standard drinks   Drug use: No   Sexual activity: Never

## 2021-06-05 ENCOUNTER — Other Ambulatory Visit: Payer: Self-pay | Admitting: Physician Assistant

## 2021-06-05 ENCOUNTER — Encounter: Payer: Self-pay | Admitting: Orthopaedic Surgery

## 2021-06-05 ENCOUNTER — Telehealth: Payer: Self-pay | Admitting: Orthopaedic Surgery

## 2021-06-05 NOTE — Telephone Encounter (Signed)
Patient called. She would like a RX for gabapentin. Her call back number is [phone]

## 2021-06-05 NOTE — Telephone Encounter (Signed)
I do not recommend

## 2021-06-06 ENCOUNTER — Other Ambulatory Visit: Payer: Self-pay | Admitting: Physician Assistant

## 2021-06-06 MED ORDER — GABAPENTIN 100 MG PO CAPS: 100.0000 mg | ORAL_CAPSULE | Freq: Three times a day (TID) | ORAL | 2 refills | Status: DC

## 2021-06-06 NOTE — Telephone Encounter (Signed)
Can you let her know that I did not realize that she was already taking this and I am happy to send in.  I am unable to see what dose she is taking, so I have started her out on the lower end to begin with

## 2021-06-06 NOTE — Telephone Encounter (Signed)
See message.  Thanks.

## 2021-09-27 ENCOUNTER — Telehealth: Payer: Medicaid Other | Admitting: Nurse Practitioner

## 2021-09-27 DIAGNOSIS — N3000 Acute cystitis without hematuria: Secondary | ICD-10-CM | POA: Diagnosis not present

## 2021-09-27 MED ORDER — CEPHALEXIN 500 MG PO CAPS: 500.0000 mg | ORAL_CAPSULE | Freq: Two times a day (BID) | ORAL | 0 refills | Status: DC

## 2021-09-27 NOTE — Progress Notes (Signed)

## 2021-09-28 ENCOUNTER — Telehealth: Payer: Medicaid Other | Admitting: Emergency Medicine

## 2021-09-28 DIAGNOSIS — B9789 Other viral agents as the cause of diseases classified elsewhere: Secondary | ICD-10-CM

## 2021-09-28 MED ORDER — FLUTICASONE PROPIONATE 50 MCG/ACT NA SUSP: 2.0000 | Freq: Every day | NASAL | 0 refills | Status: DC

## 2021-09-28 MED ORDER — PREDNISONE 5 MG (21) PO TBPK: ORAL_TABLET | ORAL | 0 refills | Status: DC

## 2021-09-28 NOTE — Addendum Note (Signed)
Addended by: Cathlyn Parsons on: 09/28/2021 07:44 PM   Modules accepted: Orders

## 2021-09-28 NOTE — Progress Notes (Addendum)
E-Visit for Sinus Problems  We are sorry that you are not feeling well.  Here is how we plan to help!  Based on what you have shared with me it looks like you have sinusitis.  Sinusitis is inflammation and infection in the sinus cavities of the head.  Based on your presentation I believe you most likely have Acute Viral Sinusitis.This is an infection most likely caused by a virus. There is not specific treatment for viral sinusitis other than to help you with the symptoms until the infection runs its course.  You may use an oral decongestant such as Mucinex D or if you have glaucoma or high blood pressure use plain Mucinex. Saline nasal spray help and can safely be used as often as needed for congestion, I have prescribed: Fluticasone nasal spray two sprays in each nostril once a day. I will also prescribe prednisone, an oral steroid, as you requested.   Some authorities believe that zinc sprays or the use of Echinacea may shorten the course of your symptoms.  Sinus infections are not as easily transmitted as other respiratory infection, however we still recommend that you avoid close contact with loved ones, especially the very young and elderly.  Remember to wash your hands thoroughly throughout the day as this is the number one way to prevent the spread of infection!  Home Care: Only take medications as instructed by your medical team. Do not take these medications with alcohol. A steam or ultrasonic humidifier can help congestion.  You can place a towel over your head and breathe in the steam from hot water coming from a faucet. Avoid close contacts especially the very young and the elderly. Cover your mouth when you cough or sneeze. Always remember to wash your hands.  Get Help Right Away If: You develop worsening fever or sinus pain. You develop a severe head ache or visual changes. Your symptoms persist after you have completed your treatment plan.  Make sure you Understand these  instructions. Will watch your condition. Will get help right away if you are not doing well or get worse.   Thank you for choosing an e-visit.  Your e-visit answers were reviewed by a board certified advanced clinical practitioner to complete your personal care plan. Depending upon the condition, your plan could have included both over the counter or prescription medications.  Please review your pharmacy choice. Make sure the pharmacy is open so you can pick up prescription now. If there is a problem, you may contact your provider through [employer] and have the prescription routed to another pharmacy.  Your safety is important to [place]. If you have drug allergies check your prescription carefully.   For the next 24 hours you can use MyChart to ask questions about today's visit, request a non-urgent call back, or ask for a work or school excuse. You will get an email in the next two days asking about your experience. I hope that your e-visit has been valuable and will speed your recovery.  I have spent 5 minutes in review of e-visit questionnaire, review and updating patient chart, medical decision making and response to patient.   Rica Mast, PhD, FNP-BC

## 2021-09-28 NOTE — Progress Notes (Signed)
Duplicate e-visit submission 

## 2021-10-08 ENCOUNTER — Other Ambulatory Visit: Payer: Self-pay | Admitting: Obstetrics

## 2021-10-08 DIAGNOSIS — Z30011 Encounter for initial prescription of contraceptive pills: Secondary | ICD-10-CM

## 2022-01-10 ENCOUNTER — Telehealth: Payer: Medicaid Other | Admitting: Physician Assistant

## 2022-01-10 DIAGNOSIS — B002 Herpesviral gingivostomatitis and pharyngotonsillitis: Secondary | ICD-10-CM

## 2022-01-10 MED ORDER — VALACYCLOVIR HCL 1 G PO TABS: 2000.0000 mg | ORAL_TABLET | Freq: Two times a day (BID) | ORAL | 0 refills | Status: AC

## 2022-01-10 NOTE — Progress Notes (Signed)
I have spent 5 minutes in review of e-visit questionnaire, review and updating patient chart, medical decision making and response to patient.   Chrisopher Pustejovsky Cody Brystal Kildow, PA-C    

## 2022-01-10 NOTE — Progress Notes (Signed)

## 2022-01-10 NOTE — Progress Notes (Signed)
Message sent to patient requesting further input regarding current symptoms. Awaiting patient response.  

## 2022-05-17 ENCOUNTER — Telehealth: Payer: Medicaid Other | Admitting: Physician Assistant

## 2022-05-17 DIAGNOSIS — J301 Allergic rhinitis due to pollen: Secondary | ICD-10-CM | POA: Diagnosis not present

## 2022-05-18 MED ORDER — LEVOCETIRIZINE DIHYDROCHLORIDE 5 MG PO TABS: 5.0000 mg | ORAL_TABLET | Freq: Every evening | ORAL | 0 refills | Status: DC

## 2022-05-18 MED ORDER — IPRATROPIUM BROMIDE 0.03 % NA SOLN: 2.0000 | Freq: Two times a day (BID) | NASAL | 0 refills | Status: DC

## 2022-05-18 MED ORDER — FLUTICASONE PROPIONATE 50 MCG/ACT NA SUSP: 2.0000 | Freq: Every day | NASAL | 0 refills | Status: DC

## 2022-05-18 NOTE — Progress Notes (Signed)
E visit for Allergic Rhinitis We are sorry that you are not feeling well.  Here is how we plan to help!  Based on what you have shared with me it looks like you have Allergic Rhinitis.  Rhinitis is when a reaction occurs that causes nasal congestion, runny nose, sneezing, and itching.  Most types of rhinitis are caused by an inflammation and are associated with symptoms in the eyes ears or throat. There are several types of rhinitis.  The most common are acute rhinitis, which is usually caused by a viral illness, allergic or seasonal rhinitis, and nonallergic or year-round rhinitis.  Nasal allergies occur certain times of the year.  Allergic rhinitis is caused when allergens in the air trigger the release of histamine in the body.  Histamine causes itching, swelling, and fluid to build up in the fragile linings of the nasal passages, sinuses and eyelids.  An itchy nose and clear discharge are common.  I recommend the following over the counter treatments: You should take a daily dose of antihistamine and Xyzal 5 mg take 1 tablet daily at bedtime. I will prescribe this medication.  I also would recommend a nasal spray: Flonase 2 sprays into each nostril once daily. I will prescribe this medication. Also, add Ipratropium bromide for acute relief of nasal congestion. Take 2 sprays twice daily for 5-7 days.   HOME CARE:  You can use an over-the-counter saline nasal spray as needed Avoid areas where there is heavy dust, mites, or molds Stay indoors on windy days during the pollen season Keep windows closed in home, at least in bedroom; use air conditioner. Use high-efficiency house air filter Keep windows closed in car, turn AC on re-circulate Avoid playing out with dog during pollen season  GET HELP RIGHT AWAY IF:  If your symptoms do not improve within 10 days You become short of breath You develop yellow or green discharge from your nose for over 3 days You have coughing fits  MAKE SURE  YOU:  Understand these instructions Will watch your condition Will get help right away if you are not doing well or get worse  Thank you for choosing an e-visit. Your e-visit answers were reviewed by a board certified advanced clinical practitioner to complete your personal care plan. Depending upon the condition, your plan could have included both over the counter or prescription medications. Please review your pharmacy choice. Be sure that the pharmacy you have chosen is open so that you can pick up your prescription now.  If there is a problem you may message your provider in [place] to have the prescription routed to another pharmacy. Your safety is important to [place]. If you have drug allergies check your prescription carefully.  For the next 24 hours, you can use MyChart to ask questions about today's visit, request a non-urgent call back, or ask for a work or school excuse from your e-visit provider. You will get an email in the next two days asking about your experience. I hope that your e-visit has been valuable and will speed your recovery.  I provided 5 minutes of non face-to-face time during this encounter for chart review and documentation.

## 2022-05-22 ENCOUNTER — Encounter: Payer: Self-pay | Admitting: Orthopaedic Surgery

## 2022-05-22 ENCOUNTER — Ambulatory Visit: Payer: Medicaid Other | Admitting: Orthopaedic Surgery

## 2022-05-22 DIAGNOSIS — G5603 Carpal tunnel syndrome, bilateral upper limbs: Secondary | ICD-10-CM

## 2022-05-22 MED ORDER — METHYLPREDNISOLONE ACETATE 40 MG/ML IJ SUSP
40.0000 mg | INTRAMUSCULAR | Status: AC | PRN
  Administered 2022-05-22: 40 mg

## 2022-05-22 MED ORDER — LIDOCAINE HCL 1 % IJ SOLN
1.0000 mL | INTRAMUSCULAR | Status: AC | PRN
  Administered 2022-05-22: 1 mL

## 2022-05-22 MED ORDER — BUPIVACAINE HCL 0.5 % IJ SOLN
1.0000 mL | INTRAMUSCULAR | Status: AC | PRN
  Administered 2022-05-22: 1 mL

## 2022-05-22 NOTE — Progress Notes (Signed)
Office Visit Note   Patient: Olivia Vasquez           Date of Birth: [birth year]           MRN: [id number] Visit Date: 05/22/2022              Requested by: [facility] [address] [place],  [place] [address] PCP: [place]: Visit Diagnoses:  1. Bilateral carpal tunnel syndrome     Plan: Impression is bilateral carpal tunnel syndrome both equally as bad.  Today, we discussed various treatment options to include repeat cortisone injections for which she would like to proceed.  She will continue wearing her night splints as long as they are helping.  She will follow-up with [place] as needed.  Call with concerns or questions.  Follow-Up Instructions: Return if symptoms worsen or fail to improve.   Orders:  No orders of the defined types were placed in this encounter.  No orders of the defined types were placed in this encounter.     Procedures: Hand/UE Inj: bilateral carpal tunnel for carpal tunnel syndrome on 05/22/2022 6:10 PM Indications: pain Details: 25 G needle Medications (Right): 1 mL lidocaine 1 %; 1 mL bupivacaine 0.5 %; 40 mg methylPREDNISolone acetate 40 MG/ML Medications (Left): 1 mL lidocaine 1 %; 1 mL bupivacaine 0.5 %; 40 mg methylPREDNISolone acetate 40 MG/ML Outcome: tolerated well, no immediate complications Patient was prepped and draped in the usual sterile fashion.       Clinical Data: No additional findings.   Subjective: Chief Complaint  Patient presents with   Right Wrist - Pain, Numbness   Left Wrist - Numbness, Pain    HPI patient is a pleasant 21 year old right-hand-dominant female who comes in today with recurrent bilateral hand pain and paresthesias both equally as bad.  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  She has been diagnosed with mild carpal tunnel syndrome years ago and is undergone yearly cortisone injections to both carpal  tunnels which have significantly helped.  Her last injections were in October of last year which helped until recently.  The pain and paresthesias have returned.  She does wear night splints which sometimes help.  She has occasionally takes gabapentin with certain flareups.  She is requesting repeat cortisone injections today.  Review of Systems as detailed in HPI.  All others reviewed and are negative.   Objective: Vital Signs: There were no vitals taken for this visit.  Physical Exam well-developed well-nourished female no acute distress.  Alert and oriented x3.  Ortho Exam bilateral hand exam revealed positive Phalen and positive Tinel both sides.  No thenar atrophy.  She is neurovascular tact distally.  Specialty Comments:  No specialty comments available.  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    Past Surgical History:  Procedure Laterality Date   SLIPPED CAPITAL FEMORAL EPIPHYSIS PINNING  06/2008   [facility] at [facility]   Social History   Occupational History   Not on file  Tobacco Use   Smoking status: Never   Smokeless tobacco: Never  Substance and Sexual Activity   Alcohol  use: No    Alcohol/week: 0.0 standard drinks of alcohol   Drug use: No   Sexual activity: Never

## 2022-05-25 ENCOUNTER — Encounter: Payer: Self-pay | Admitting: Orthopaedic Surgery

## 2022-05-27 ENCOUNTER — Other Ambulatory Visit: Payer: Self-pay | Admitting: Physician Assistant

## 2022-05-27 MED ORDER — PREDNISONE 10 MG (21) PO TBPK: ORAL_TABLET | ORAL | 0 refills | Status: DC

## 2022-05-27 NOTE — Telephone Encounter (Signed)
I sent in a steroid taper to try and help settle them down.  Do not take nsaids while on this.  I would also give the injections two weeks to kick in

## 2022-09-10 ENCOUNTER — Telehealth: Payer: Self-pay

## 2022-09-10 ENCOUNTER — Other Ambulatory Visit: Payer: Self-pay

## 2022-09-10 DIAGNOSIS — Z30011 Encounter for initial prescription of contraceptive pills: Secondary | ICD-10-CM

## 2022-09-10 NOTE — Telephone Encounter (Signed)
LVM for patient regarding prescription refill. Informed patient she will need to call and schedule an appointment for further refill request.

## 2022-10-01 ENCOUNTER — Ambulatory Visit
Admission: EM | Admit: 2022-10-01 | Discharge: 2022-10-01 | Disposition: A | Discharge status: Home / Self Care | Payer: Medicaid Other | Attending: Emergency Medicine | Admitting: Emergency Medicine

## 2022-10-01 DIAGNOSIS — R6883 Chills (without fever): Secondary | ICD-10-CM | POA: Diagnosis present

## 2022-10-01 DIAGNOSIS — J4520 Mild intermittent asthma, uncomplicated: Secondary | ICD-10-CM | POA: Insufficient documentation

## 2022-10-01 DIAGNOSIS — B349 Viral infection, unspecified: Secondary | ICD-10-CM

## 2022-10-01 DIAGNOSIS — Z1152 Encounter for screening for COVID-19: Secondary | ICD-10-CM | POA: Diagnosis not present

## 2022-10-01 LAB — POCT RAPID STREP A (OFFICE): Rapid Strep A Screen: NEGATIVE

## 2022-10-01 NOTE — ED Triage Notes (Signed)
Patient to Urgent Care with complaints of sore throat and generalized body aches. Reports sinus pressure and pain. Painful swallowing. Reports hot flashes and chills.  Symptoms started this morning.

## 2022-10-01 NOTE — ED Provider Notes (Signed)
Olivia Vasquez    CSN: [id number] Arrival date & time: 10/01/22  1821      History   Chief Complaint Chief Complaint  Patient presents with   Sore Throat    I have a sore throat & body aches. - Entered by patient    HPI Olivia Vasquez is a 22 y.o. female.  Patient presents with chills, body aches, sore throat, sinus pressure, congestion since this morning.  Treatment at home with OTC sinus medication.  No fever, rash, cough, wheezing, shortness of breath, vomiting, diarrhea, or other symptoms.  Her medical history includes asthma, allergies.  The history is provided by the patient and medical records.    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      Home Medications    Prior to Admission medications   Medication Sig Start Date End Date Taking? Authorizing Provider  RETIN-A 0.025 % cream Apply 1 Application topically daily. 09/07/22  Yes [provider]  BLISOVI 24 FE 1-20 MG-MCG(24) tablet TAKE 1 TABLET BY MOUTH EVERY DAY 10/09/21   Shelly Bombard, MD  fluticasone [facility]) 50 MCG/ACT nasal spray Place 2 sprays into both nostrils daily. 05/18/22   Mar Daring, PA-C  gabapentin (NEURONTIN) 100 MG capsule Take 1 capsule (100 mg total) by mouth 3 (three) times daily. 06/06/21   Aundra Dubin, PA-C  ibuprofen (ADVIL,MOTRIN) 800 MG tablet TAKE 1 TABLET EVERY 8  HOURS AS NEEDED FOR HEADACHE OR MILD TO MODERATE PAIN 08/05/18   Shelly Bombard, MD  ipratropium (ATROVENT) 0.03 % nasal spray Place 2 sprays into both nostrils every 12 (twelve) hours. 05/18/22   Mar Daring, PA-C  levocetirizine (XYZAL) 5 MG tablet Take 1 tablet (5 mg total) by mouth every evening. 05/18/22   Mar Daring, PA-C  predniSONE (STERAPRED UNI-PAK 21 TAB) 10 MG (21) TBPK tablet Take as directed 05/27/22   Aundra Dubin, PA-C  Prenat-FeCbn-FeAsp-Meth-FA-DHA (PRENATE MINI) 18-0.6-0.4-350 MG CAPS TAKE 1 CAPSULE BY MOUTH AT BEDTIME. 04/27/19   Shelly Bombard, MD    Family History Family History  Problem Relation Age of Onset   Diabetes Paternal Grandmother    Hypertension Paternal Grandmother    Kidney disease Maternal Uncle        cancer   Kidney disease Maternal Grandmother        cancer   Hypertension Maternal Grandmother    Thyroid disease Maternal Grandmother    Cancer Maternal Grandfather        fatty liver.    Social History Social History   Tobacco Use   Smoking status: Never   Smokeless tobacco: Never  Substance Use Topics   Alcohol use: Yes    Comment: socially   Drug use: No     Allergies   Patient has no known allergies.  Review of Systems Review of Systems  Constitutional:  Positive for chills. Negative for fever.  HENT:  Positive for congestion, sinus pressure and sore throat. Negative for ear pain.   Respiratory:  Negative for cough, shortness of breath and wheezing.   Cardiovascular:  Negative for chest pain and palpitations.  Gastrointestinal:  Negative for diarrhea and vomiting.  Skin:  Negative for color change and rash.  All other systems reviewed and are negative.    Physical Exam Triage Vital Signs ED Triage Vitals  Enc Vitals Group     BP      Pulse      Resp      Temp      Temp src      SpO2      Weight      Height      Head Circumference      Peak Flow      Pain Score      Pain Loc      Pain  Edu?      Excl. in [place]?    No data found.  Updated Vital Signs BP 139/87   Pulse (!) 104   Temp 98.4 F (36.9 C)   Resp 20   LMP 09/25/2022   SpO2 99%   Visual Acuity Right Eye Distance:   Left Eye Distance:   Bilateral Distance:    Right Eye Near:   Left Eye Near:    Bilateral Near:     Physical Exam Vitals and nursing note reviewed.  Constitutional:      General: She is not in acute distress.    Appearance: Normal appearance. She is well-developed. She is not ill-appearing.  HENT:     Right Ear: Tympanic membrane normal.     Left Ear: Tympanic membrane normal.     Nose: Nose normal.     Mouth/Throat:     Mouth: Mucous membranes are moist.     Pharynx: Posterior oropharyngeal erythema present.  Cardiovascular:     Rate and Rhythm: Normal rate and regular rhythm.     Heart sounds: Normal heart sounds.  Pulmonary:     Effort: Pulmonary effort is normal. No respiratory distress.     Breath sounds: Normal breath sounds.  Musculoskeletal:     Cervical back: Neck supple.  Skin:    General: Skin is warm and dry.  Neurological:     Mental Status: She is alert.  Psychiatric:        Mood and Affect: Mood normal.        Behavior: Behavior normal.      UC Treatments / Results  Labs (all labs ordered are listed, but only abnormal results are displayed) Labs Reviewed  SARS CORONAVIRUS 2 (TAT 6-24 HRS)  POCT RAPID STREP A (OFFICE)    EKG   Radiology No results found.  Procedures Procedures (including critical care time)  Medications Ordered in UC Medications - No data to display  Initial Impression / Assessment and Plan / UC Course  I have reviewed the triage vital signs and the nursing notes.  Pertinent labs & imaging results that were available during my care of the patient were reviewed by me and considered in my medical decision making (see chart for details).    Viral illness.  Rapid strep negative.  COVID pending.  Discussed symptomatic treatment  including Tylenol, rest, hydration.  Instructed patient to follow up with her PCP if symptoms are not improving.  She agrees to plan of  care.   Final Clinical Impressions(s) / UC Diagnoses   Final diagnoses:  Viral illness     Discharge Instructions      The strep test is negative.    Your COVID test is pending.    Take Tylenol as needed for fever or discomfort.  Rest and keep yourself hydrated.    Follow-up with your primary care provider if your symptoms are not improving.         ED Prescriptions   None    PDMP not reviewed this encounter.   Sharion Balloon, NP 10/01/22 [phone]

## 2022-10-01 NOTE — Discharge Instructions (Signed)
The strep test is negative.  Your COVID test is pending.    Take Tylenol as needed for fever or discomfort.  Rest and keep yourself hydrated.    Follow-up with your primary care provider if your symptoms are not improving.     

## 2022-10-02 LAB — SARS CORONAVIRUS 2 (TAT 6-24 HRS): SARS Coronavirus 2: NEGATIVE

## 2022-10-07 ENCOUNTER — Telehealth: Payer: Medicaid Other | Admitting: Physician Assistant

## 2022-10-07 DIAGNOSIS — H109 Unspecified conjunctivitis: Secondary | ICD-10-CM | POA: Diagnosis not present

## 2022-10-07 MED ORDER — POLYMYXIN B-TRIMETHOPRIM 10000-0.1 UNIT/ML-% OP SOLN: 1.0000 [drp] | Freq: Four times a day (QID) | OPHTHALMIC | 0 refills | Status: DC

## 2022-10-07 MED ORDER — OFLOXACIN 0.3 % OP SOLN: 1.0000 [drp] | Freq: Four times a day (QID) | OPHTHALMIC | 0 refills | Status: DC

## 2022-10-07 NOTE — Progress Notes (Signed)
E-Visit for [employer]   We are sorry that you are not feeling well.  Here is how we plan to help!  Based on what you have shared with me it looks like you have conjunctivitis.  Conjunctivitis is a common inflammatory or infectious condition of the eye that is often referred to as "pink eye".  In most cases it is contagious (viral or bacterial). However, not all conjunctivitis requires antibiotics (ex. Allergic).  We have made appropriate suggestions for you based upon your presentation.  I have prescribed Polytrim Ophthalmic drops 1-2 drops 4 times a day times 5 days  Please continue with Mucinex, Flonase, and drink plenty of fluids for congestion.  Can take Ibuprofen or Tylenol for sore throat   Pink eye can be highly contagious.  It is typically spread through direct contact with secretions, or contaminated objects or surfaces that one may have touched.  Strict handwashing is suggested with soap and water is urged.  If not available, use alcohol based had sanitizer.  Avoid unnecessary touching of the eye.  If you wear contact lenses, you will need to refrain from wearing them until you see no white discharge from the eye for at least 24 hours after being on medication.  You should see symptom improvement in 1-2 days after starting the medication regimen.  Call us if symptoms are not improved in 1-2 days.  Home Care: Wash your hands often! Do not wear your contacts until you complete your treatment plan. Avoid sharing towels, bed linen, personal items with a person who has pink eye. See attention for anyone in your home with similar symptoms.  Get Help Right Away If: Your symptoms do not improve. You develop blurred or loss of vision. Your symptoms worsen (increased discharge, pain or redness)   Thank you for choosing an e-visit.  Your e-visit answers were reviewed by a board certified advanced clinical practitioner to complete your personal care plan. Depending upon the condition, your  plan could have included both over the counter or prescription medications.  Please review your pharmacy choice. Make sure the pharmacy is open so you can pick up prescription now. If there is a problem, you may contact your provider through [employer] and have the prescription routed to another pharmacy.  Your safety is important to [place]. If you have drug allergies check your prescription carefully.   For the next 24 hours you can use MyChart to ask questions about today's visit, request a non-urgent call back, or ask for a work or school excuse. You will get an email in the next two days asking about your experience. I hope that your e-visit has been valuable and will speed your recovery.   I have spent 5 minutes in review of e-visit questionnaire, review and updating patient chart, medical decision making and response to patient.   Lenise Arena Ward, PA-C

## 2022-10-07 NOTE — Addendum Note (Signed)
Addended by: Lyndon Code Z on: 10/07/2022 03:24 PM   Modules accepted: Orders

## 2022-10-16 ENCOUNTER — Encounter: Payer: Self-pay | Admitting: Obstetrics

## 2022-10-17 ENCOUNTER — Ambulatory Visit: Payer: Medicaid Other | Admitting: Obstetrics

## 2022-11-26 ENCOUNTER — Encounter: Payer: Self-pay | Admitting: Orthopaedic Surgery

## 2022-11-26 NOTE — Telephone Encounter (Signed)
We can send in a steroid pack, but if she is scheduled for this Wednesday, I would wait and proceed with injections instead of they helped back in september

## 2022-11-26 NOTE — Telephone Encounter (Signed)
Yes I agree with Mendel Ryder to wait.

## 2022-11-28 ENCOUNTER — Encounter: Payer: Self-pay | Admitting: Obstetrics

## 2022-11-28 ENCOUNTER — Ambulatory Visit (INDEPENDENT_AMBULATORY_CARE_PROVIDER_SITE_OTHER): Payer: Medicaid Other | Admitting: Licensed Clinical Social Worker

## 2022-11-28 ENCOUNTER — Other Ambulatory Visit (HOSPITAL_COMMUNITY)
Admission: RE | Admit: 2022-11-28 | Discharge: 2022-11-28 | Disposition: A | Discharge status: Home / Self Care | Payer: Medicaid Other | Source: Ambulatory Visit | Attending: Obstetrics | Admitting: Obstetrics

## 2022-11-28 ENCOUNTER — Ambulatory Visit: Payer: Medicaid Other | Admitting: Obstetrics

## 2022-11-28 ENCOUNTER — Ambulatory Visit: Payer: Medicaid Other | Admitting: Orthopaedic Surgery

## 2022-11-28 VITALS — BP 115/78 | HR 74 | Ht 63.0 in | Wt 149.0 lb

## 2022-11-28 DIAGNOSIS — F4321 Adjustment disorder with depressed mood: Secondary | ICD-10-CM | POA: Diagnosis not present

## 2022-11-28 DIAGNOSIS — Z01419 Encounter for gynecological examination (general) (routine) without abnormal findings: Secondary | ICD-10-CM

## 2022-11-28 DIAGNOSIS — G5603 Carpal tunnel syndrome, bilateral upper limbs: Secondary | ICD-10-CM

## 2022-11-28 DIAGNOSIS — Z3041 Encounter for surveillance of contraceptive pills: Secondary | ICD-10-CM

## 2022-11-28 DIAGNOSIS — Z3202 Encounter for pregnancy test, result negative: Secondary | ICD-10-CM

## 2022-11-28 DIAGNOSIS — N898 Other specified noninflammatory disorders of vagina: Secondary | ICD-10-CM | POA: Diagnosis not present

## 2022-11-28 DIAGNOSIS — Z113 Encounter for screening for infections with a predominantly sexual mode of transmission: Secondary | ICD-10-CM

## 2022-11-28 DIAGNOSIS — Z3009 Encounter for other general counseling and advice on contraception: Secondary | ICD-10-CM | POA: Diagnosis not present

## 2022-11-28 DIAGNOSIS — E569 Vitamin deficiency, unspecified: Secondary | ICD-10-CM

## 2022-11-28 DIAGNOSIS — Z1339 Encounter for screening examination for other mental health and behavioral disorders: Secondary | ICD-10-CM | POA: Diagnosis not present

## 2022-11-28 DIAGNOSIS — F321 Major depressive disorder, single episode, moderate: Secondary | ICD-10-CM

## 2022-11-28 DIAGNOSIS — N946 Dysmenorrhea, unspecified: Secondary | ICD-10-CM

## 2022-11-28 LAB — POCT URINE PREGNANCY: Preg Test, Ur: NEGATIVE

## 2022-11-28 MED ORDER — BUPIVACAINE HCL 0.5 % IJ SOLN
1.0000 mL | INTRAMUSCULAR | Status: AC | PRN
Start: 2022-11-28 — End: 2022-11-28
  Administered 2022-11-28: 1 mL

## 2022-11-28 MED ORDER — IBUPROFEN 800 MG PO TABS: ORAL_TABLET | ORAL | 6 refills | Status: DC

## 2022-11-28 MED ORDER — PRENATE MINI 18-0.6-0.4-350 MG PO CAPS: 1.0000 | ORAL_CAPSULE | Freq: Every day | ORAL | 11 refills | Status: DC

## 2022-11-28 MED ORDER — BLISOVI 24 FE 1-20 MG-MCG(24) PO TABS: 1.0000 | ORAL_TABLET | Freq: Every day | ORAL | 3 refills | Status: DC

## 2022-11-28 MED ORDER — METHYLPREDNISOLONE ACETATE 40 MG/ML IJ SUSP
40.0000 mg | INTRAMUSCULAR | Status: AC | PRN
Start: 2022-11-28 — End: 2022-11-28
  Administered 2022-11-28: 40 mg

## 2022-11-28 MED ORDER — LIDOCAINE HCL 1 % IJ SOLN
1.0000 mL | INTRAMUSCULAR | Status: AC | PRN
Start: 2022-11-28 — End: 2022-11-28
  Administered 2022-11-28: 1 mL

## 2022-11-28 NOTE — Progress Notes (Signed)
22 y.o. New GYN presents AEX/PAP/STD screening.  C/o irregular period, she started OCP 10/08/22 and did not have a period in March.

## 2022-11-28 NOTE — Progress Notes (Signed)
   Office Visit Note   Patient: Olivia Vasquez           Date of Birth: Sep 07, 2000           MRN: [id number] Visit Date: 11/28/2022              Requested by: [facility] [address] [place],  [place] [address] PCP: [place]: Visit Diagnoses:  1. Bilateral carpal tunnel syndrome     Plan: Bilateral carpal tunnel injections performed today.  She would like to make arrangements to have carpal tunnel release on the right hand in the near future.  Olivia Vasquez will call the patient to confirm surgical time.  Risk benefits prognosis reviewed.  She tolerated the injections well.  Follow-Up Instructions: No follow-ups on file.   Orders:  No orders of the defined types were placed in this encounter.  No orders of the defined types were placed in this encounter.     Procedures: Hand/UE Inj: bilateral carpal tunnel for carpal tunnel syndrome on 11/28/2022 10:10 AM Indications: pain Details: 25 G needle Medications (Right): 1 mL lidocaine 1 %; 1 mL bupivacaine 0.5 %; 40 mg methylPREDNISolone acetate 40 MG/ML Medications (Left): 1 mL lidocaine 1 %; 1 mL bupivacaine 0.5 %; 40 mg methylPREDNISolone acetate 40 MG/ML Outcome: tolerated well, no immediate complications Patient was prepped and draped in the usual sterile fashion.       Clinical Data: No additional findings.   Subjective: Chief Complaint  Patient presents with   Right Wrist - Follow-up   Left Wrist - Follow-up    HPI  Olivia Vasquez returns to for bilateral carpal tunnel syndrome.  Requesting injections.  Review of Systems   Objective: Vital Signs: LMP 10/03/2022 (Exact Date)   Physical Exam  Ortho Exam  Examination of bilateral hands is unchanged.  Specialty Comments:  No specialty comments available.  Imaging: No results found.   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    Past Surgical History:  Procedure Laterality Date   SLIPPED CAPITAL FEMORAL EPIPHYSIS PINNING  06/2008   Pinned at [facility] History   Occupational History   Not on file  Tobacco Use   Smoking status: Never   Smokeless tobacco: Never  Vaping Use   Vaping Use: Never used  Substance and Sexual Activity   Alcohol use: Yes    Comment: socially   Drug use: No   Sexual activity: Yes    Birth control/protection: OCP
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
## 2022-11-29 LAB — HEPATITIS C ANTIBODY: Hep C Virus Ab: NONREACTIVE

## 2022-11-29 LAB — CERVICOVAGINAL ANCILLARY ONLY
Bacterial Vaginitis (gardnerella): NEGATIVE
Candida Glabrata: NEGATIVE
Candida Vaginitis: NEGATIVE
Chlamydia: NEGATIVE
Comment: NEGATIVE
Comment: NEGATIVE
Comment: NEGATIVE
Comment: NEGATIVE
Comment: NEGATIVE
Comment: NORMAL
Neisseria Gonorrhea: NEGATIVE
Trichomonas: NEGATIVE

## 2022-11-29 LAB — RPR: RPR Ser Ql: NONREACTIVE

## 2022-11-29 LAB — HEPATITIS B SURFACE ANTIGEN: Hepatitis B Surface Ag: NEGATIVE

## 2022-11-29 LAB — HIV ANTIBODY (ROUTINE TESTING W REFLEX): HIV Screen 4th Generation wRfx: NONREACTIVE
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
## 2022-12-04 LAB — CYTOLOGY - PAP
Comment: NEGATIVE
Comment: NEGATIVE
Comment: NEGATIVE
Diagnosis: UNDETERMINED — AB
HPV 16: NEGATIVE
HPV 18 / 45: NEGATIVE
High risk HPV: POSITIVE — AB

## 2022-12-05 ENCOUNTER — Encounter: Payer: Self-pay | Admitting: Obstetrics

## 2022-12-05 NOTE — Progress Notes (Signed)
TC from pt. Advised of results and recommendations. Reassurance provided. All questions answered. [facility] education on HPV and and on colposcopy sent to pt. Call transferred to schedulers.

## 2022-12-12 ENCOUNTER — Ambulatory Visit: Payer: Medicaid Other | Admitting: Licensed Clinical Social Worker

## 2022-12-26 ENCOUNTER — Ambulatory Visit (INDEPENDENT_AMBULATORY_CARE_PROVIDER_SITE_OTHER): Payer: Medicaid Other | Admitting: Obstetrics and Gynecology

## 2022-12-26 ENCOUNTER — Encounter: Payer: Self-pay | Admitting: Obstetrics and Gynecology

## 2022-12-26 VITALS — BP 125/89 | HR 98

## 2022-12-26 DIAGNOSIS — Z3041 Encounter for surveillance of contraceptive pills: Secondary | ICD-10-CM

## 2022-12-26 DIAGNOSIS — R87610 Atypical squamous cells of undetermined significance on cytologic smear of cervix (ASC-US): Secondary | ICD-10-CM

## 2022-12-26 LAB — POCT URINE PREGNANCY: Preg Test, Ur: NEGATIVE

## 2022-12-26 MED ORDER — BLISOVI 24 FE 1-20 MG-MCG(24) PO TABS
1.0000 | ORAL_TABLET | Freq: Every day | ORAL | 3 refills | Status: DC
Start: 2022-12-26 — End: 2023-01-04

## 2022-12-26 NOTE — Progress Notes (Signed)
Pt would like to discuss HPV vaccine. Pt states she got 2 doses at at 16/17. Pt also needs refill on [facility] as pharmacy didn't have it initially.

## 2022-12-26 NOTE — Progress Notes (Signed)
22 yo P0 here to discuss recent abnormal pap smear and originally scheduled for colposcopy. Patient reports feeling well. She is sexually active using COC for contraception. She had a recent abnormal pap smear of ASCUS +HPV. 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   Social History   Tobacco Use   Smoking status: Never   Smokeless tobacco: Never  Vaping Use   Vaping Use: Never used  Substance Use Topics   Alcohol use: Yes    Comment: socially   Drug use: No   ROS  See pertinent in HPI. All other systems reviewed and non contributory Blood pressure 125/89, pulse 98, last menstrual period 10/03/2022. GENERAL: Well-developed, well-nourished female in no acute distress.  Neuro: Alert and oriented x 3  A/P 22 yo with abnormal pap smear - Education provided on HPV and its role with abnormal pap smear/cervical cancer - Informed patient that per ASCCP guidelines, patient needs to have a pap smear next year - Patient states she received Gardasil vaccine via pediatrician- advised to confirm.  - Refill on COC provided - RTC in 1 year

## 2022-12-27 ENCOUNTER — Encounter: Payer: Self-pay | Admitting: Obstetrics and Gynecology

## 2023-01-04 ENCOUNTER — Ambulatory Visit (INDEPENDENT_AMBULATORY_CARE_PROVIDER_SITE_OTHER): Payer: Medicaid Other

## 2023-01-04 ENCOUNTER — Ambulatory Visit: Payer: Medicaid Other

## 2023-01-04 VITALS — BP 135/86 | HR 71 | Ht 63.0 in | Wt 148.6 lb

## 2023-01-04 DIAGNOSIS — Z23 Encounter for immunization: Secondary | ICD-10-CM

## 2023-01-04 DIAGNOSIS — Z3041 Encounter for surveillance of contraceptive pills: Secondary | ICD-10-CM

## 2023-01-04 MED ORDER — BLISOVI 24 FE 1-20 MG-MCG(24) PO TABS
1.0000 | ORAL_TABLET | Freq: Every day | ORAL | 3 refills | Status: DC
Start: 2023-01-04 — End: 2024-04-06

## 2023-01-04 NOTE — Progress Notes (Signed)
Patient presents for Gardasil vaccine. First injection given today in RD. Pt tolerated well. Pt to return in 2 months for next injection. No concerns at this time.

## 2023-02-05 ENCOUNTER — Other Ambulatory Visit: Payer: Self-pay

## 2023-02-05 ENCOUNTER — Encounter (HOSPITAL_BASED_OUTPATIENT_CLINIC_OR_DEPARTMENT_OTHER): Payer: Self-pay | Admitting: Orthopaedic Surgery

## 2023-02-11 ENCOUNTER — Telehealth: Payer: Medicaid Other | Admitting: Physician Assistant

## 2023-02-11 DIAGNOSIS — B3731 Acute candidiasis of vulva and vagina: Secondary | ICD-10-CM

## 2023-02-11 MED ORDER — FLUCONAZOLE 150 MG PO TABS
150.0000 mg | ORAL_TABLET | ORAL | 0 refills | Status: DC | PRN
Start: 2023-02-11 — End: 2023-02-22

## 2023-02-11 NOTE — Anesthesia Preprocedure Evaluation (Signed)
Anesthesia Evaluation  Patient identified by MRN, date of birth, ID band Patient awake    Reviewed: Allergy & Precautions, NPO status , Patient's Chart, lab work & pertinent test results  Airway Mallampati: II  TM Distance: >3 FB Neck ROM: Full    Dental  (+) Teeth Intact, Dental Advisory Given   Pulmonary asthma (well controlled, no inhaler)    Pulmonary exam normal breath sounds clear to auscultation       Cardiovascular negative cardio ROS Normal cardiovascular exam Rhythm:Regular Rate:Normal     Neuro/Psych negative neurological ROS  negative psych ROS   GI/Hepatic Neg liver ROS,GERD  Controlled,,  Endo/Other  negative endocrine ROS    Renal/GU negative Renal ROS  negative genitourinary   Musculoskeletal negative musculoskeletal ROS (+)    Abdominal   Peds  Hematology negative hematology ROS (+)   Anesthesia Other Findings   Reproductive/Obstetrics                             Anesthesia Physical Anesthesia Plan  ASA: 2  Anesthesia Plan: MAC   Post-op Pain Management: Tylenol PO (pre-op)* and Toradol IV (intra-op)*   Induction:   PONV Risk Score and Plan: 2 and Propofol infusion and TIVA  Airway Management Planned: Natural Airway and Simple Face Mask  Additional Equipment: None  Intra-op Plan:   Post-operative Plan:   Informed Consent: I have reviewed the patients History and Physical, chart, labs and discussed the procedure including the risks, benefits and alternatives for the proposed anesthesia with the patient or authorized representative who has indicated his/her understanding and acceptance.     Dental advisory given  Plan Discussed with: CRNA  Anesthesia Plan Comments:        Anesthesia Quick Evaluation

## 2023-02-11 NOTE — Progress Notes (Signed)

## 2023-02-13 ENCOUNTER — Ambulatory Visit (HOSPITAL_BASED_OUTPATIENT_CLINIC_OR_DEPARTMENT_OTHER): Payer: Medicaid Other | Admitting: Anesthesiology

## 2023-02-13 ENCOUNTER — Encounter (HOSPITAL_BASED_OUTPATIENT_CLINIC_OR_DEPARTMENT_OTHER)
Admission: RE | Disposition: A | Discharge status: Home / Self Care | Payer: Self-pay | Source: Home / Self Care | Attending: Orthopaedic Surgery

## 2023-02-13 ENCOUNTER — Encounter (HOSPITAL_BASED_OUTPATIENT_CLINIC_OR_DEPARTMENT_OTHER): Payer: Self-pay | Admitting: Orthopaedic Surgery

## 2023-02-13 ENCOUNTER — Ambulatory Visit (HOSPITAL_BASED_OUTPATIENT_CLINIC_OR_DEPARTMENT_OTHER)
Admission: RE | Admit: 2023-02-13 | Discharge: 2023-02-13 | Disposition: A | Discharge status: Home / Self Care | Payer: Medicaid Other | Attending: Orthopaedic Surgery | Admitting: Orthopaedic Surgery

## 2023-02-13 ENCOUNTER — Other Ambulatory Visit: Payer: Self-pay

## 2023-02-13 DIAGNOSIS — G5601 Carpal tunnel syndrome, right upper limb: Secondary | ICD-10-CM | POA: Diagnosis not present

## 2023-02-13 DIAGNOSIS — J45909 Unspecified asthma, uncomplicated: Secondary | ICD-10-CM | POA: Diagnosis not present

## 2023-02-13 DIAGNOSIS — Z01818 Encounter for other preprocedural examination: Secondary | ICD-10-CM

## 2023-02-13 HISTORY — PX: CARPAL TUNNEL RELEASE: SHX101

## 2023-02-13 HISTORY — DX: Carpal tunnel syndrome, bilateral upper limbs: G56.03

## 2023-02-13 LAB — POCT PREGNANCY, URINE: Preg Test, Ur: NEGATIVE

## 2023-02-13 SURGERY — CARPAL TUNNEL RELEASE
Anesthesia: Monitor Anesthesia Care | Site: Wrist | Laterality: Right

## 2023-02-13 MED ORDER — OXYCODONE HCL 5 MG PO TABS: 5.0000 mg | ORAL_TABLET | Freq: Once | ORAL | Status: DC | PRN

## 2023-02-13 MED ORDER — BUPIVACAINE HCL (PF) 0.25 % IJ SOLN
INTRAMUSCULAR | Status: DC | PRN
  Administered 2023-02-13: 5 mL

## 2023-02-13 MED ORDER — FENTANYL CITRATE (PF) 100 MCG/2ML IJ SOLN
INTRAMUSCULAR | Status: AC
  Filled 2023-02-13: qty 2

## 2023-02-13 MED ORDER — KETOROLAC TROMETHAMINE 30 MG/ML IJ SOLN: 30.0000 mg | Freq: Once | INTRAMUSCULAR | Status: DC | PRN

## 2023-02-13 MED ORDER — ONDANSETRON HCL 4 MG/2ML IJ SOLN: 4.0000 mg | Freq: Once | INTRAMUSCULAR | Status: DC | PRN

## 2023-02-13 MED ORDER — ONDANSETRON HCL 4 MG/2ML IJ SOLN
INTRAMUSCULAR | Status: DC | PRN
  Administered 2023-02-13: 4 mg via INTRAVENOUS

## 2023-02-13 MED ORDER — LACTATED RINGERS IV SOLN: INTRAVENOUS | Status: DC

## 2023-02-13 MED ORDER — ONDANSETRON HCL 4 MG/2ML IJ SOLN
INTRAMUSCULAR | Status: AC
  Filled 2023-02-13: qty 2

## 2023-02-13 MED ORDER — HYDROCODONE-ACETAMINOPHEN 5-325 MG PO TABS: 1.0000 | ORAL_TABLET | Freq: Four times a day (QID) | ORAL | 0 refills | Status: DC | PRN

## 2023-02-13 MED ORDER — FENTANYL CITRATE (PF) 100 MCG/2ML IJ SOLN
INTRAMUSCULAR | Status: DC | PRN
  Administered 2023-02-13: 50 ug via INTRAVENOUS

## 2023-02-13 MED ORDER — PROPOFOL 10 MG/ML IV BOLUS
INTRAVENOUS | Status: DC | PRN
  Administered 2023-02-13 (×2): 10 mg via INTRAVENOUS
  Administered 2023-02-13 (×2): 20 mg via INTRAVENOUS
  Administered 2023-02-13 (×2): 10 mg via INTRAVENOUS

## 2023-02-13 MED ORDER — MIDAZOLAM HCL 2 MG/2ML IJ SOLN
INTRAMUSCULAR | Status: AC
  Filled 2023-02-13: qty 2

## 2023-02-13 MED ORDER — HYDROMORPHONE HCL 1 MG/ML IJ SOLN: 0.2500 mg | INTRAMUSCULAR | Status: DC | PRN

## 2023-02-13 MED ORDER — ACETAMINOPHEN 500 MG PO TABS
ORAL_TABLET | ORAL | Status: AC
  Filled 2023-02-13: qty 2

## 2023-02-13 MED ORDER — OXYCODONE HCL 5 MG/5ML PO SOLN: 5.0000 mg | Freq: Once | ORAL | Status: DC | PRN

## 2023-02-13 MED ORDER — AMISULPRIDE (ANTIEMETIC) 5 MG/2ML IV SOLN: 10.0000 mg | Freq: Once | INTRAVENOUS | Status: DC | PRN

## 2023-02-13 MED ORDER — MEPERIDINE HCL 25 MG/ML IJ SOLN: 6.2500 mg | INTRAMUSCULAR | Status: DC | PRN

## 2023-02-13 MED ORDER — LIDOCAINE-EPINEPHRINE (PF) 1 %-1:200000 IJ SOLN
INTRAMUSCULAR | Status: DC | PRN
  Administered 2023-02-13: 5 mL

## 2023-02-13 MED ORDER — CEFAZOLIN SODIUM-DEXTROSE 2-4 GM/100ML-% IV SOLN
INTRAVENOUS | Status: AC
  Filled 2023-02-13: qty 100

## 2023-02-13 MED ORDER — PROPOFOL 500 MG/50ML IV EMUL
INTRAVENOUS | Status: DC | PRN
  Administered 2023-02-13: 100 ug/kg/min via INTRAVENOUS

## 2023-02-13 MED ORDER — ACETAMINOPHEN 500 MG PO TABS
1000.0000 mg | ORAL_TABLET | Freq: Once | ORAL | Status: AC
  Administered 2023-02-13: 1000 mg via ORAL

## 2023-02-13 MED ORDER — CEFAZOLIN SODIUM-DEXTROSE 2-4 GM/100ML-% IV SOLN
2.0000 g | INTRAVENOUS | Status: AC
  Administered 2023-02-13: 2 g via INTRAVENOUS

## 2023-02-13 MED ORDER — MIDAZOLAM HCL 2 MG/2ML IJ SOLN
INTRAMUSCULAR | Status: DC | PRN
  Administered 2023-02-13: 2 mg via INTRAVENOUS

## 2023-02-13 SURGICAL SUPPLY — 44 items
BAND INSRT 18 STRL LF DISP RB (MISCELLANEOUS) ×2
BAND RUBBER #18 3X1/16 STRL (MISCELLANEOUS) ×2 IMPLANT
BLADE MINI RND TIP GREEN BEAV (BLADE) ×1 IMPLANT
BLADE SURG 15 STRL LF DISP TIS (BLADE) ×1 IMPLANT
BLADE SURG 15 STRL SS (BLADE) ×1
BNDG CMPR 5X3 KNIT ELC UNQ LF (GAUZE/BANDAGES/DRESSINGS) ×1
BNDG CMPR 9X4 STRL LF SNTH (GAUZE/BANDAGES/DRESSINGS) ×1
BNDG ELASTIC 3INX 5YD STR LF (GAUZE/BANDAGES/DRESSINGS) ×1 IMPLANT
BNDG ESMARK 4X9 LF (GAUZE/BANDAGES/DRESSINGS) ×1 IMPLANT
BRUSH SCRUB EZ PLAIN DRY (MISCELLANEOUS) ×1 IMPLANT
CANISTER SUCT 1200ML W/VALVE (MISCELLANEOUS) ×1 IMPLANT
CORD BIPOLAR FORCEPS 12FT (ELECTRODE) ×1 IMPLANT
COVER BACK TABLE 60X90IN (DRAPES) ×1 IMPLANT
CUFF TOURN SGL QUICK 18X4 (TOURNIQUET CUFF) ×1 IMPLANT
DRAPE EXTREMITY T 121X128X90 (DISPOSABLE) ×1 IMPLANT
DRAPE SURG 17X23 STRL (DRAPES) ×1 IMPLANT
DRAPE U-SHAPE 47X51 STRL (DRAPES) ×1 IMPLANT
DURAPREP 26ML APPLICATOR (WOUND CARE) ×1 IMPLANT
GAUZE SPONGE 4X4 12PLY STRL (GAUZE/BANDAGES/DRESSINGS) ×1 IMPLANT
GAUZE XEROFORM 1X8 LF (GAUZE/BANDAGES/DRESSINGS) ×1 IMPLANT
GLOVE BIOGEL PI IND STRL 7.5 (GLOVE) ×1 IMPLANT
GLOVE ECLIPSE 7.0 STRL STRAW (GLOVE) ×1 IMPLANT
GLOVE INDICATOR 7.0 STRL GRN (GLOVE) ×1 IMPLANT
GLOVE SURG SYN 7.5 E (GLOVE) ×2 IMPLANT
GLOVE SURG SYN 7.5 PF PI (GLOVE) ×2 IMPLANT
GOWN STRL REUS W/ TWL LRG LVL3 (GOWN DISPOSABLE) ×1 IMPLANT
GOWN STRL REUS W/TWL LRG LVL3 (GOWN DISPOSABLE) ×1
GOWN STRL SURGICAL XL XLNG (GOWN DISPOSABLE) ×2 IMPLANT
NDL HYPO 25X1 1.5 SAFETY (NEEDLE) ×1 IMPLANT
NEEDLE HYPO 25X1 1.5 SAFETY (NEEDLE) ×1 IMPLANT
NS IRRIG 1000ML POUR BTL (IV SOLUTION) ×1 IMPLANT
PACK BASIN DAY SURGERY FS (CUSTOM PROCEDURE TRAY) ×1 IMPLANT
PAD CAST 3X4 CTTN HI CHSV (CAST SUPPLIES) ×1 IMPLANT
PADDING CAST COTTON 3X4 STRL (CAST SUPPLIES) ×1
SHEET MEDIUM DRAPE 40X70 STRL (DRAPES) ×1 IMPLANT
SPIKE FLUID TRANSFER (MISCELLANEOUS) IMPLANT
SPONGE T-LAP 18X18 ~~LOC~~+RFID (SPONGE) ×1 IMPLANT
STOCKINETTE 4X48 STRL (DRAPES) ×1 IMPLANT
SUT ETHILON 3 0 PS 1 (SUTURE) ×1 IMPLANT
SYR BULB EAR ULCER 3OZ GRN STR (SYRINGE) ×1 IMPLANT
SYR CONTROL 10ML LL (SYRINGE) IMPLANT
TOWEL GREEN STERILE FF (TOWEL DISPOSABLE) ×1 IMPLANT
TRAY DSU PREP LF (CUSTOM PROCEDURE TRAY) ×1 IMPLANT
UNDERPAD 30X36 HEAVY ABSORB (UNDERPADS AND DIAPERS) ×1 IMPLANT

## 2023-02-13 NOTE — Transfer of Care (Signed)
Immediate Anesthesia Transfer of Care Note  Patient: Olivia Vasquez  Procedure(s) Performed: RIGHT CARPAL TUNNEL RELEASE (Right: Wrist)  Patient Location: PACU  Anesthesia Type:MAC  Level of Consciousness: awake, alert , and oriented  Airway & Oxygen Therapy: Patient Spontanous Breathing and Patient connected to face mask oxygen  Post-op Assessment: Report given to RN and Post -op Vital signs reviewed and stable  Post vital signs: Reviewed and stable  Last Vitals:  Vitals Value Taken Time  BP 135/83 (96)   Temp    Pulse 87 02/13/23 1226  Resp 11 02/13/23 1226  SpO2 100 % 02/13/23 1226  Vitals shown include unvalidated device data.  Last Pain:  Vitals:   02/13/23 1053  TempSrc: Oral  PainSc: 0-No pain      Patients Stated Pain Goal: 2 (02/13/23 1053)  Complications: No notable events documented.

## 2023-02-13 NOTE — Anesthesia Postprocedure Evaluation (Signed)
Anesthesia Post Note  Patient: Olivia Vasquez  Procedure(s) Performed: RIGHT CARPAL TUNNEL RELEASE (Right: Wrist)     Patient location during evaluation: PACU Anesthesia Type: MAC Level of consciousness: awake and alert Pain management: pain level controlled Vital Signs Assessment: post-procedure vital signs reviewed and stable Respiratory status: spontaneous breathing, nonlabored ventilation and respiratory function stable Cardiovascular status: blood pressure returned to baseline and stable Postop Assessment: no apparent nausea or vomiting Anesthetic complications: no   No notable events documented.  Last Vitals:  Vitals:   02/13/23 1245 02/13/23 1303  BP: 132/71 138/87  Pulse: 66 (!) 59  Resp: 13 16  Temp:  36.8 C  SpO2: 100% 100%    Last Pain:  Vitals:   02/13/23 1303  TempSrc:   PainSc: 0-No pain                 Lannie Fields

## 2023-02-13 NOTE — Op Note (Signed)
   Carpal tunnel op note  DATE OF SURGERY:02/13/2023  PREOPERATIVE DIAGNOSIS:  Right carpal tunnel syndrome  POSTOPERATIVE DIAGNOSIS: same  PROCEDURE: Right carpal tunnel release. CPT 16109  SURGEON: Cheral Almas, M.D.  ASSIST: Oneal Grout, [place]  ANESTHESIA:  Local and MAC  TOURNIQUET TIME: less than 20 minutes  BLOOD LOSS: Minimal.  COMPLICATIONS: None.  PATHOLOGY: None.  INDICATIONS: The patient is a 22 y.o. -year-old female who presented with carpal tunnel syndrome failing nonsurgical management, indicated for surgical release.  DESCRIPTION OF PROCEDURE: The patient was identified in the preoperative holding area.  The operative site was marked by the surgeon and confirmed by the patient.  The patient was brought back to the operating room.  MAC anesthesia was administered.  Local anesthetic with epi was injected into the operative site.  A well padded nonsterile tourniquet was placed. The operative extremity was prepped and draped in standard sterile fashion.  A timeout was performed.  Preoperative antibiotics were given.   A palmar incision was made about 5 mm ulnar to the thenar crease.  The palmar aponeurosis was exposed and divided in line with the skin incision. The palmaris brevis was visualized and divided.  The distal edge of the transcarpal ligament was identified. A hemostat was inserted into the carpal tunnel to protect the median nerve and the flexor tendons. Then, the transverse carpal ligament was released under direct visualization. Proximally, a subcutaneous tunnel was made allowing a Sewell retractor to be placed. Then, the distal portion of the antebrachial fascia was released. Distally, all fibrous bands were released. The median nerve was visualized, and the fat pad was exposed. Wound was irrigated and closed with 4-0 nylon sutures. Sterile dressing applied. The patient was transferred to the recovery room in stable condition after all counts were  correct.  POSTOPERATIVE PLAN: To start nerve gliding exercises as tolerated and no heavy lifting for four weeks.  Glee Arvin, M.D. [facility]  12:21 PM

## 2023-02-13 NOTE — H&P (Signed)
PREOPERATIVE H&P  Chief Complaint: right carpal tunnel syndrome  HPI: Olivia Vasquez is a 22 y.o. female who presents for surgical treatment of right carpal tunnel syndrome.  She denies any changes in medical history.  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   surgery at [facility] (left side only)   Past Surgical History:  Procedure Laterality Date   SLIPPED CAPITAL FEMORAL EPIPHYSIS PINNING  06/2008   [facility]inned at [facility]   Social History   Socioeconomic History   Marital status: Single    Spouse name: Not on file   Number of children: 0   Years of education: Not on file   Highest education level: Not on file  Occupational History   Not on file  Tobacco Use   Smoking status: Never   Smokeless tobacco: Never  Vaping Use   Vaping Use: Never used  Substance and Sexual Activity   Alcohol use: Yes    Comment: socially   Drug use: No   Sexual activity: Yes    Birth control/protection: OCP  Other Topics Concern   Not on file  Social History Narrative   Lives at home with grandmother. .  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     Social Determinants of Health   Financial Resource Strain: Not on file  Food Insecurity: Not on file  Transportation Needs: Not on file  Physical Activity: Not on file  Stress: Not on file  Social Connections: Not on file   Family History  Problem Relation Age of Onset   Diabetes Paternal Grandmother    Hypertension Paternal Grandmother    Kidney disease Maternal Uncle        cancer   Kidney disease Maternal Grandmother        cancer   Hypertension Maternal Grandmother    Thyroid disease Maternal Grandmother    Cancer Maternal Grandfather        fatty liver.   No Known Allergies Prior to Admission medications   Medication Sig Start Date End Date Taking? Authorizing Provider  fluconazole (DIFLUCAN) 150 MG  tablet Take 1 tablet (150 mg total) by mouth every 3 (three) days as needed. 02/11/23  Yes Margaretann Loveless, PA-C  gabapentin (NEURONTIN) 100 MG capsule Take 1 capsule (100 mg total) by mouth 3 (three) times daily. 06/06/21  Yes Cristie Hem, PA-C  ibuprofen (ADVIL) 800 MG tablet TAKE 1 TABLET EVERY 8 HOURS AS NEEDED FOR HEADACHE OR MILD TO MODERATE PAIN 11/28/22  Yes Brock Bad, MD  Norethindrone Acetate-Ethinyl Estrad-FE (BLISOVI 24 FE) 1-20 MG-MCG(24) tablet Take 1 tablet by mouth daily. 01/04/23  Yes Brock Bad, MD  Prenat-FeCbn-FeAsp-Meth-FA-DHA (PRENATE MINI) 18-0.6-0.4-350 MG CAPS Take 1 capsule by mouth at bedtime. 11/28/22  Yes Brock Bad, MD  RETIN-A 0.025 % cream Apply 1 Application topically daily. 09/07/22  Yes [provider]     Positive ROS: All other systems have been reviewed and were otherwise negative with the exception of those mentioned in the HPI and as above.  Physical Exam: General: Alert, no acute distress Cardiovascular: No pedal edema Respiratory: No cyanosis, no use of accessory musculature GI: abdomen soft Skin: No lesions in the area of chief complaint Neurologic: Sensation intact distally Psychiatric: Patient is competent for consent with normal mood and affect Lymphatic: no lymphedema  MUSCULOSKELETAL: exam stable  Assessment: right  carpal tunnel syndrome  Plan: Plan for Procedure(s): RIGHT CARPAL TUNNEL RELEASE  The risks benefits and alternatives were discussed with the patient including but not limited to the risks of nonoperative treatment, versus surgical intervention including infection, bleeding, nerve injury,  blood clots, cardiopulmonary complications, morbidity, mortality, among others, and they were willing to proceed.   Glee Arvin, MD 02/13/2023 10:50 AM

## 2023-02-13 NOTE — Discharge Instructions (Addendum)
Postoperative instructions:  Weightbearing instructions: don't lift more than 10 lbs for 4 weeks  Dressing instructions: Keep your dressing and/or splint clean and dry at all times.  It will be removed at your first post-operative appointment.  Your stitches and/or staples will be removed at this visit.  Incision instructions:  Do not soak your incision for 3 weeks after surgery.  If the incision gets wet, pat dry and do not scrub the incision.  Pain control:  You have been given a prescription to be taken as directed for post-operative pain control.  In addition, elevate the operative extremity above the heart at all times to prevent swelling and throbbing pain.  Take over-the-counter Colace, 100mg  by mouth twice a day while taking narcotic pain medications to help prevent constipation.  Follow up appointments: 1) 10 days for suture removal and wound check. 2) Dr. Roda Shutters as scheduled.   -------------------------------------------------------------------------------------------------------------  After Surgery Pain Control:  After your surgery, post-surgical discomfort or pain is likely. This discomfort can last several days to a few weeks. At certain times of the day your discomfort may be more intense.  Did you receive a nerve block?  A nerve block can provide pain relief for one hour to two days after your surgery. As long as the nerve block is working, you will experience little or no sensation in the area the surgeon operated on.  As the nerve block wears off, you will begin to experience pain or discomfort. It is very important that you begin taking your prescribed pain medication before the nerve block fully wears off. Treating your pain at the first sign of the block wearing off will ensure your pain is better controlled and more tolerable when full-sensation returns. Do not wait until the pain is intolerable, as the medicine will be less effective. It is better to treat pain in  advance than to try and catch up.  General Anesthesia:  If you did not receive a nerve block during your surgery, you will need to start taking your pain medication shortly after your surgery and should continue to do so as prescribed by your surgeon.  Pain Medication:  Most commonly we prescribe Vicodin and Percocet for post-operative pain. Both of these medications contain a combination of acetaminophen (Tylenol) and a narcotic to help control pain.   It takes between 30 and 45 minutes before pain medication starts to work. It is important to take your medication before your pain level gets too intense.   Nausea is a common side effect of many pain medications. You will want to eat something before taking your pain medicine to help prevent nausea.   If you are taking a prescription pain medication that contains acetaminophen, we recommend that you do not take additional over the counter acetaminophen (Tylenol).  Other pain relieving options:   Using a cold pack to ice the affected area a few times a day (15 to 20 minutes at a time) can help to relieve pain, reduce swelling and bruising.   Elevation of the affected area can also help to reduce pain and swelling.   No Tylenol until after 5:00pm today, if needed.   Post Anesthesia Home Care Instructions  Activity: Get plenty of rest for the remainder of the day. A responsible individual must stay with you for 24 hours following the procedure.  For the next 24 hours, DO NOT: -Drive a car -Advertising copywriter -Drink alcoholic beverages -Take any medication unless instructed by your physician -Make any  legal decisions or sign important papers.  Meals: Start with liquid foods such as gelatin or soup. Progress to regular foods as tolerated. Avoid greasy, spicy, heavy foods. If nausea and/or vomiting occur, drink only clear liquids until the nausea and/or vomiting subsides. Call your physician if vomiting continues.  Special  Instructions/Symptoms: Your throat may feel dry or sore from the anesthesia or the breathing tube placed in your throat during surgery. If this causes discomfort, gargle with warm salt water. The discomfort should disappear within 24 hours.  If you had a scopolamine patch placed behind your ear for the management of post- operative nausea and/or vomiting:  1. The medication in the patch is effective for 72 hours, after which it should be removed.  Wrap patch in a tissue and discard in the trash. Wash hands thoroughly with soap and water. 2. You may remove the patch earlier than 72 hours if you experience unpleasant side effects which may include dry mouth, dizziness or visual disturbances. 3. Avoid touching the patch. Wash your hands with soap and water after contact with the patch.

## 2023-02-14 ENCOUNTER — Encounter (HOSPITAL_BASED_OUTPATIENT_CLINIC_OR_DEPARTMENT_OTHER): Payer: Self-pay | Admitting: Orthopaedic Surgery

## 2023-02-22 ENCOUNTER — Ambulatory Visit (INDEPENDENT_AMBULATORY_CARE_PROVIDER_SITE_OTHER): Payer: Medicaid Other | Admitting: Physician Assistant

## 2023-02-22 ENCOUNTER — Ambulatory Visit (HOSPITAL_BASED_OUTPATIENT_CLINIC_OR_DEPARTMENT_OTHER): Payer: Self-pay | Admitting: Orthopaedic Surgery

## 2023-02-22 ENCOUNTER — Telehealth: Payer: Self-pay | Admitting: Physician Assistant

## 2023-02-22 ENCOUNTER — Other Ambulatory Visit (INDEPENDENT_AMBULATORY_CARE_PROVIDER_SITE_OTHER): Payer: Self-pay | Admitting: Orthopaedic Surgery

## 2023-02-22 ENCOUNTER — Other Ambulatory Visit (HOSPITAL_BASED_OUTPATIENT_CLINIC_OR_DEPARTMENT_OTHER): Payer: Self-pay | Admitting: Orthopaedic Surgery

## 2023-02-22 DIAGNOSIS — G5601 Carpal tunnel syndrome, right upper limb: Secondary | ICD-10-CM

## 2023-02-22 MED ORDER — OXYCODONE HCL 5 MG PO TABS: 5.0000 mg | ORAL_TABLET | ORAL | 0 refills | Status: DC | PRN

## 2023-02-22 MED ORDER — HYDROCODONE-ACETAMINOPHEN 5-325 MG PO TABS: 1.0000 | ORAL_TABLET | Freq: Two times a day (BID) | ORAL | 0 refills | Status: DC | PRN

## 2023-02-22 NOTE — Telephone Encounter (Signed)
Pt was advised to come back Wednesday please advise no avail appt until 07/09

## 2023-02-22 NOTE — Telephone Encounter (Signed)
Scheduled patient

## 2023-02-22 NOTE — Telephone Encounter (Signed)
Pt called in stating his Oxycodone did not come in I advised him it was sent through another delivery service and he may need to wait a little longer for it to come in but he does not want too he would like it sent to [facility] on [address] so they can mail it to him please advise

## 2023-02-22 NOTE — Progress Notes (Signed)
   Post-Op Visit Note   Patient: Olivia Vasquez           Date of Birth: 2001/02/01           MRN: [id number] Visit Date: 02/22/2023 PCP: [facility]   Assessment & Plan:  Chief Complaint:  Chief Complaint  Patient presents with   Right Wrist - Follow-up    Right carpal tunnel release 02/13/2023   Visit Diagnoses:  1. Carpal tunnel syndrome on right     Plan: Patient is a pleasant 22 year old female who comes in today 9 days status post right carpal tunnel release 02/13/2023.  She has been doing relatively well.  She denies any paresthesias but she has been having some discomfort for which she takes Norco at night.  Examination of her right hand reveals a well healing surgical incision with nylon sutures in place.  No evidence of infection or cellulitis.  Fingers are warm well-perfused.  She is neurovascular intact distally.  Today, her wound was cleaned and recovered.  Removable Velcro splint was applied for which she will wear at all times for the next week.  She may begin nerve gliding exercises.  No heavy lifting or submerging her hand underwater for another 3 weeks.  Follow-up next week for suture removal.  Call with concerns or questions.  Follow-Up Instructions: Return in about 5 days (around 02/27/2023).   Orders:  No orders of the defined types were placed in this encounter.  No orders of the defined types were placed in this encounter.   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    Past Surgical History:  Procedure Laterality Date   CARPAL TUNNEL RELEASE Right 02/13/2023   Procedure: RIGHT CARPAL TUNNEL RELEASE;  Surgeon: Tarry Kos, MD;  Location: [facility];  Service: Orthopedics;  Laterality: Right;   SLIPPED CAPITAL FEMORAL EPIPHYSIS PINNING  06/2008   Pinned at [facility]   Social History   Occupational History   Not on file  Tobacco Use   Smoking status: Never   Smokeless tobacco: Never  Vaping Use   Vaping Use: Never used  Substance and Sexual Activity   Alcohol use: Yes    Comment: socially   Drug use: No   Sexual activity: Yes    Birth control/protection: OCP

## 2023-02-26 ENCOUNTER — Telehealth: Payer: Medicaid Other | Admitting: Physician Assistant

## 2023-02-26 DIAGNOSIS — K047 Periapical abscess without sinus: Secondary | ICD-10-CM | POA: Diagnosis not present

## 2023-02-26 MED ORDER — AMOXICILLIN-POT CLAVULANATE 875-125 MG PO TABS
1.0000 | ORAL_TABLET | Freq: Two times a day (BID) | ORAL | 0 refills | Status: DC
Start: 2023-02-26 — End: 2023-03-20

## 2023-02-26 NOTE — Progress Notes (Signed)

## 2023-02-26 NOTE — Progress Notes (Signed)
I have spent 5 minutes in review of e-visit questionnaire, review and updating patient chart, medical decision making and response to patient.   Dajahnae Vondra Cody Pesach Frisch, PA-C    

## 2023-03-05 ENCOUNTER — Encounter (HOSPITAL_BASED_OUTPATIENT_CLINIC_OR_DEPARTMENT_OTHER): Payer: Self-pay | Admitting: Orthopaedic Surgery

## 2023-03-05 ENCOUNTER — Ambulatory Visit (INDEPENDENT_AMBULATORY_CARE_PROVIDER_SITE_OTHER): Payer: Medicaid Other | Admitting: Physician Assistant

## 2023-03-05 ENCOUNTER — Other Ambulatory Visit: Payer: Self-pay

## 2023-03-05 DIAGNOSIS — G5601 Carpal tunnel syndrome, right upper limb: Secondary | ICD-10-CM

## 2023-03-05 DIAGNOSIS — Z9889 Other specified postprocedural states: Secondary | ICD-10-CM

## 2023-03-05 NOTE — Progress Notes (Signed)
   Post-Op Visit Note   Patient: Olivia Vasquez           Date of Birth: 26-May-2001           MRN: [id number] Visit Date: 03/05/2023 PCP: [facility], [facility]   Assessment & Plan:  Chief Complaint:  Chief Complaint  Patient presents with   Right Hand - Routine Post Op   Visit Diagnoses:  1. Carpal tunnel syndrome on right   2. S/P carpal tunnel release     Plan: Patient is a pleasant 22 year old female who comes in today approximately 3 weeks status post right carpal tunnel release 02/13/2023.  She has been doing fairly well.  No paresthesias and minimal pain.  Examination of the right hand reveals a fully healed surgical incision with nylon sutures in place.  No evidence of infection or cellulitis.  Fingers warm well-perfused.  Today, sutures were removed and Steri-Strips applied.  No heavy lifting or submerging her hand underwater until she is 4 weeks postop.  She will continue with range of motion exercises.  Follow-up in 3 weeks for recheck.  Call with concerns or questions.  Follow-Up Instructions: Return in about 3 weeks (around 03/26/2023).   Orders:  No orders of the defined types were placed in this encounter.  No orders of the defined types were placed in this encounter.   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    Past Surgical History:  Procedure Laterality Date   CARPAL TUNNEL RELEASE Right 02/13/2023   Procedure: RIGHT CARPAL TUNNEL RELEASE;  Surgeon: Tarry Kos, MD;  Location: [facility];  Service: Orthopedics;  Laterality: Right;   SLIPPED CAPITAL FEMORAL EPIPHYSIS PINNING  06/2008   Pinned at [facility]   Social History   Occupational History   Not on file  Tobacco Use   Smoking status: Never   Smokeless tobacco: Never  Vaping Use   Vaping Use: Never used  Substance and Sexual Activity   Alcohol use: Yes    Comment: socially   Drug use: No   Sexual activity: Yes    Birth control/protection: OCP

## 2023-03-06 ENCOUNTER — Ambulatory Visit: Payer: Medicaid Other

## 2023-03-07 ENCOUNTER — Ambulatory Visit (INDEPENDENT_AMBULATORY_CARE_PROVIDER_SITE_OTHER): Payer: Medicaid Other

## 2023-03-07 ENCOUNTER — Other Ambulatory Visit: Payer: Self-pay | Admitting: Physician Assistant

## 2023-03-07 VITALS — BP 119/73 | HR 75

## 2023-03-07 DIAGNOSIS — Z23 Encounter for immunization: Secondary | ICD-10-CM | POA: Diagnosis not present

## 2023-03-07 NOTE — Progress Notes (Signed)
Patient presents for second Gardasil vaccine. First injection tolerated well. Patient will return 07/05/2023 for the third injection. No concerns at this time.

## 2023-03-07 NOTE — Progress Notes (Signed)
Patient was assessed and managed by nursing staff during this encounter. I have reviewed the chart and agree with the documentation and plan. I have also made any necessary editorial changes.  Warden Fillers, MD 03/07/2023 11:25 AM

## 2023-03-11 ENCOUNTER — Encounter: Payer: Self-pay | Admitting: Orthopaedic Surgery

## 2023-03-11 ENCOUNTER — Other Ambulatory Visit: Payer: Self-pay | Admitting: Physician Assistant

## 2023-03-11 MED ORDER — HYDROCODONE-ACETAMINOPHEN 5-325 MG PO TABS: 1.0000 | ORAL_TABLET | Freq: Two times a day (BID) | ORAL | 0 refills | Status: DC | PRN

## 2023-03-13 DIAGNOSIS — Z01818 Encounter for other preprocedural examination: Secondary | ICD-10-CM

## 2023-03-20 ENCOUNTER — Encounter (HOSPITAL_BASED_OUTPATIENT_CLINIC_OR_DEPARTMENT_OTHER): Payer: Self-pay | Admitting: Orthopaedic Surgery

## 2023-03-20 ENCOUNTER — Other Ambulatory Visit: Payer: Self-pay

## 2023-03-22 ENCOUNTER — Encounter: Payer: Medicaid Other | Admitting: Physician Assistant

## 2023-03-25 ENCOUNTER — Other Ambulatory Visit: Payer: Self-pay | Admitting: Physician Assistant

## 2023-03-26 ENCOUNTER — Ambulatory Visit (INDEPENDENT_AMBULATORY_CARE_PROVIDER_SITE_OTHER): Payer: Medicaid Other | Admitting: Physician Assistant

## 2023-03-26 DIAGNOSIS — Z9889 Other specified postprocedural states: Secondary | ICD-10-CM

## 2023-03-26 DIAGNOSIS — G5601 Carpal tunnel syndrome, right upper limb: Secondary | ICD-10-CM

## 2023-03-26 NOTE — Progress Notes (Signed)
   Post-Op Visit Note   Patient: Olivia Vasquez           Date of Birth: [birth year]           MRN: [id number] Visit Date: 03/26/2023 PCP: [facility], [facility]   Assessment & Plan:  Chief Complaint:  Chief Complaint  Patient presents with   Right Hand - Routine Post Op   Visit Diagnoses:  1. Carpal tunnel syndrome on right   2. S/P carpal tunnel release     Plan: Patient is a pleasant 22 year old female who comes in today 6 weeks status post right carpal tunnel release 02/13/2023.  She has been doing well.  She notes occasional pain when stretching her hand.  Otherwise no pain or paresthesias.  Examination of her right hand reveals a fully healed surgical scar without complication.  She is neurovascularly intact distally.  At this point, she will continue to advance with activity as tolerated.  Follow-up as needed.  Follow-Up Instructions: Return if symptoms worsen or fail to improve.   Orders:  No orders of the defined types were placed in this encounter.  No orders of the defined types were placed in this encounter.   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    Past Surgical History:  Procedure Laterality Date   CARPAL TUNNEL RELEASE Right 02/13/2023   Procedure: RIGHT CARPAL TUNNEL RELEASE;  Surgeon: Tarry Kos, MD;  Location: [facility];  Service: Orthopedics;  Laterality: Right;   SLIPPED CAPITAL FEMORAL EPIPHYSIS PINNING  06/2008   Pinned at [facility]   Social History   Occupational History   Not on file  Tobacco Use   Smoking status: Never   Smokeless tobacco: Never  Vaping Use   Vaping status: Never Used  Substance and Sexual Activity   Alcohol use: Yes    Comment: socially   Drug use: No   Sexual activity: Yes    Birth control/protection: OCP

## 2023-03-27 ENCOUNTER — Ambulatory Visit (HOSPITAL_BASED_OUTPATIENT_CLINIC_OR_DEPARTMENT_OTHER)
Admission: RE | Admit: 2023-03-27 | Discharge: 2023-03-27 | Disposition: A | Discharge status: Home / Self Care | Payer: Medicaid Other | Attending: Orthopaedic Surgery | Admitting: Orthopaedic Surgery

## 2023-03-27 ENCOUNTER — Other Ambulatory Visit: Payer: Self-pay | Admitting: Physician Assistant

## 2023-03-27 ENCOUNTER — Encounter (HOSPITAL_BASED_OUTPATIENT_CLINIC_OR_DEPARTMENT_OTHER)
Admission: RE | Disposition: A | Discharge status: Home / Self Care | Payer: Self-pay | Source: Home / Self Care | Attending: Orthopaedic Surgery

## 2023-03-27 ENCOUNTER — Ambulatory Visit (HOSPITAL_BASED_OUTPATIENT_CLINIC_OR_DEPARTMENT_OTHER): Payer: Medicaid Other | Admitting: Anesthesiology

## 2023-03-27 ENCOUNTER — Other Ambulatory Visit: Payer: Self-pay

## 2023-03-27 ENCOUNTER — Encounter (HOSPITAL_BASED_OUTPATIENT_CLINIC_OR_DEPARTMENT_OTHER): Payer: Self-pay | Admitting: Orthopaedic Surgery

## 2023-03-27 DIAGNOSIS — G5602 Carpal tunnel syndrome, left upper limb: Secondary | ICD-10-CM | POA: Insufficient documentation

## 2023-03-27 DIAGNOSIS — Z01818 Encounter for other preprocedural examination: Secondary | ICD-10-CM

## 2023-03-27 HISTORY — DX: Gastro-esophageal reflux disease without esophagitis: K21.9

## 2023-03-27 HISTORY — DX: Unspecified asthma, uncomplicated: J45.909

## 2023-03-27 HISTORY — PX: CARPAL TUNNEL RELEASE: SHX101

## 2023-03-27 LAB — POCT PREGNANCY, URINE: Preg Test, Ur: NEGATIVE

## 2023-03-27 SURGERY — CARPAL TUNNEL RELEASE
Anesthesia: Monitor Anesthesia Care | Site: Wrist | Laterality: Left

## 2023-03-27 MED ORDER — MIDAZOLAM HCL 5 MG/5ML IJ SOLN
INTRAMUSCULAR | Status: DC | PRN
  Administered 2023-03-27: 2 mg via INTRAVENOUS

## 2023-03-27 MED ORDER — PROMETHAZINE HCL 25 MG/ML IJ SOLN: 6.2500 mg | INTRAMUSCULAR | Status: DC | PRN

## 2023-03-27 MED ORDER — MIDAZOLAM HCL 2 MG/2ML IJ SOLN
INTRAMUSCULAR | Status: AC
  Filled 2023-03-27: qty 2

## 2023-03-27 MED ORDER — OXYCODONE HCL 5 MG/5ML PO SOLN: 5.0000 mg | Freq: Once | ORAL | Status: DC | PRN

## 2023-03-27 MED ORDER — LIDOCAINE-EPINEPHRINE (PF) 1 %-1:200000 IJ SOLN
INTRAMUSCULAR | Status: DC | PRN
  Administered 2023-03-27: 10 mL

## 2023-03-27 MED ORDER — FENTANYL CITRATE (PF) 100 MCG/2ML IJ SOLN
INTRAMUSCULAR | Status: DC | PRN
  Administered 2023-03-27 (×2): 50 ug via INTRAVENOUS

## 2023-03-27 MED ORDER — PROPOFOL 10 MG/ML IV BOLUS
INTRAVENOUS | Status: DC | PRN
  Administered 2023-03-27: 50 mg via INTRAVENOUS

## 2023-03-27 MED ORDER — PROPOFOL 500 MG/50ML IV EMUL
INTRAVENOUS | Status: DC | PRN
  Administered 2023-03-27: 100 ug/kg/min via INTRAVENOUS

## 2023-03-27 MED ORDER — LIDOCAINE HCL (CARDIAC) PF 100 MG/5ML IV SOSY
PREFILLED_SYRINGE | INTRAVENOUS | Status: DC | PRN
  Administered 2023-03-27: 40 mg via INTRAVENOUS

## 2023-03-27 MED ORDER — HYDROCODONE-ACETAMINOPHEN 5-325 MG PO TABS: 1.0000 | ORAL_TABLET | Freq: Three times a day (TID) | ORAL | 0 refills | Status: DC | PRN

## 2023-03-27 MED ORDER — CEFAZOLIN SODIUM-DEXTROSE 2-4 GM/100ML-% IV SOLN
2.0000 g | INTRAVENOUS | Status: AC
  Administered 2023-03-27: 2 g via INTRAVENOUS

## 2023-03-27 MED ORDER — BUPIVACAINE HCL (PF) 0.25 % IJ SOLN
INTRAMUSCULAR | Status: DC | PRN
  Administered 2023-03-27: 10 mL

## 2023-03-27 MED ORDER — FENTANYL CITRATE (PF) 100 MCG/2ML IJ SOLN
INTRAMUSCULAR | Status: AC
  Filled 2023-03-27: qty 2

## 2023-03-27 MED ORDER — LACTATED RINGERS IV SOLN: INTRAVENOUS | Status: DC

## 2023-03-27 MED ORDER — FENTANYL CITRATE (PF) 100 MCG/2ML IJ SOLN: 25.0000 ug | INTRAMUSCULAR | Status: DC | PRN

## 2023-03-27 MED ORDER — OXYCODONE HCL 5 MG PO TABS: 5.0000 mg | ORAL_TABLET | Freq: Once | ORAL | Status: DC | PRN

## 2023-03-27 MED ORDER — ONDANSETRON HCL 4 MG/2ML IJ SOLN
INTRAMUSCULAR | Status: DC | PRN
  Administered 2023-03-27: 4 mg via INTRAVENOUS

## 2023-03-27 MED ORDER — ACETAMINOPHEN 500 MG PO TABS
ORAL_TABLET | ORAL | Status: AC
  Filled 2023-03-27: qty 2

## 2023-03-27 MED ORDER — CEFAZOLIN SODIUM-DEXTROSE 2-4 GM/100ML-% IV SOLN
INTRAVENOUS | Status: AC
  Filled 2023-03-27: qty 100

## 2023-03-27 MED ORDER — ACETAMINOPHEN 500 MG PO TABS
1000.0000 mg | ORAL_TABLET | Freq: Once | ORAL | Status: AC
  Administered 2023-03-27: 1000 mg via ORAL

## 2023-03-27 MED ORDER — KETOROLAC TROMETHAMINE 30 MG/ML IJ SOLN: 30.0000 mg | Freq: Once | INTRAMUSCULAR | Status: DC | PRN

## 2023-03-27 MED ORDER — AMISULPRIDE (ANTIEMETIC) 5 MG/2ML IV SOLN: 10.0000 mg | Freq: Once | INTRAVENOUS | Status: DC | PRN

## 2023-03-27 MED ORDER — DEXMEDETOMIDINE HCL IN NACL 80 MCG/20ML IV SOLN
INTRAVENOUS | Status: DC | PRN
Start: 2023-03-27 — End: 2023-03-27
  Administered 2023-03-27: 8 ug via INTRAVENOUS

## 2023-03-27 SURGICAL SUPPLY — 44 items
BAND INSRT 18 STRL LF DISP RB (MISCELLANEOUS) ×2
BAND RUBBER #18 3X1/16 STRL (MISCELLANEOUS) ×2 IMPLANT
BLADE MINI RND TIP GREEN BEAV (BLADE) ×1 IMPLANT
BLADE SURG 15 STRL LF DISP TIS (BLADE) ×1 IMPLANT
BLADE SURG 15 STRL SS (BLADE) ×1
BNDG CMPR 5X3 KNIT ELC UNQ LF (GAUZE/BANDAGES/DRESSINGS) ×1
BNDG CMPR 9X4 STRL LF SNTH (GAUZE/BANDAGES/DRESSINGS) ×1
BNDG ELASTIC 3INX 5YD STR LF (GAUZE/BANDAGES/DRESSINGS) ×1 IMPLANT
BNDG ESMARK 4X9 LF (GAUZE/BANDAGES/DRESSINGS) ×1 IMPLANT
BRUSH SCRUB EZ PLAIN DRY (MISCELLANEOUS) ×1 IMPLANT
CANISTER SUCT 1200ML W/VALVE (MISCELLANEOUS) ×1 IMPLANT
CORD BIPOLAR FORCEPS 12FT (ELECTRODE) ×1 IMPLANT
COVER BACK TABLE 60X90IN (DRAPES) ×1 IMPLANT
CUFF TOURN SGL QUICK 18X4 (TOURNIQUET CUFF) ×1 IMPLANT
DRAPE EXTREMITY T 121X128X90 (DISPOSABLE) ×1 IMPLANT
DRAPE SURG 17X23 STRL (DRAPES) ×1 IMPLANT
DRAPE U-SHAPE 47X51 STRL (DRAPES) ×1 IMPLANT
DURAPREP 26ML APPLICATOR (WOUND CARE) ×1 IMPLANT
GAUZE SPONGE 4X4 12PLY STRL (GAUZE/BANDAGES/DRESSINGS) ×1 IMPLANT
GAUZE XEROFORM 1X8 LF (GAUZE/BANDAGES/DRESSINGS) ×1 IMPLANT
GLOVE BIOGEL PI IND STRL 7.5 (GLOVE) ×1 IMPLANT
GLOVE ECLIPSE 7.0 STRL STRAW (GLOVE) ×1 IMPLANT
GLOVE INDICATOR 7.0 STRL GRN (GLOVE) ×1 IMPLANT
GLOVE SURG SYN 7.5 E (GLOVE) ×2
GLOVE SURG SYN 7.5 PF PI (GLOVE) ×2 IMPLANT
GOWN STRL REUS W/ TWL LRG LVL3 (GOWN DISPOSABLE) ×1 IMPLANT
GOWN STRL REUS W/TWL LRG LVL3 (GOWN DISPOSABLE) ×1
GOWN STRL SURGICAL XL XLNG (GOWN DISPOSABLE) ×2 IMPLANT
NDL HYPO 25X1 1.5 SAFETY (NEEDLE) ×1 IMPLANT
NEEDLE HYPO 25X1 1.5 SAFETY (NEEDLE) ×1
NS IRRIG 1000ML POUR BTL (IV SOLUTION) ×1 IMPLANT
PACK BASIN DAY SURGERY FS (CUSTOM PROCEDURE TRAY) ×1 IMPLANT
PAD CAST 3X4 CTTN HI CHSV (CAST SUPPLIES) ×1 IMPLANT
PADDING CAST COTTON 3X4 STRL (CAST SUPPLIES) ×1
SHEET MEDIUM DRAPE 40X70 STRL (DRAPES) ×1 IMPLANT
SPIKE FLUID TRANSFER (MISCELLANEOUS) IMPLANT
SPONGE T-LAP 18X18 ~~LOC~~+RFID (SPONGE) ×1 IMPLANT
STOCKINETTE 4X48 STRL (DRAPES) ×1 IMPLANT
SUT ETHILON 3 0 PS 1 (SUTURE) ×1 IMPLANT
SYR BULB EAR ULCER 3OZ GRN STR (SYRINGE) ×1 IMPLANT
SYR CONTROL 10ML LL (SYRINGE) IMPLANT
TOWEL GREEN STERILE FF (TOWEL DISPOSABLE) ×1 IMPLANT
TRAY DSU PREP LF (CUSTOM PROCEDURE TRAY) ×1 IMPLANT
UNDERPAD 30X36 HEAVY ABSORB (UNDERPADS AND DIAPERS) ×1 IMPLANT

## 2023-03-27 NOTE — Transfer of Care (Signed)
Immediate Anesthesia Transfer of Care Note  Patient: Olivia Vasquez  Procedure(s) Performed: LEFT CARPAL TUNNEL RELEASE (Left: Wrist)  Patient Location: PACU  Anesthesia Type:MAC  Level of Consciousness: awake, alert , and oriented  Airway & Oxygen Therapy: Patient Spontanous Breathing and Patient connected to face mask oxygen  Post-op Assessment: Report given to RN and Post -op Vital signs reviewed and stable  Post vital signs: Reviewed and stable  Last Vitals:  Vitals Value Taken Time  BP 111/76 03/27/23 1245  Temp    Pulse 93 03/27/23 1245  Resp 21 03/27/23 1245  SpO2 100 % 03/27/23 1245    Last Pain:  Vitals:   03/27/23 1058  TempSrc:   PainSc: 0-No pain         Complications: No notable events documented.

## 2023-03-27 NOTE — H&P (Signed)
PREOPERATIVE H&P  Chief Complaint: left carpal tunnel syndrome  HPI: Olivia Vasquez is a 22 y.o. female who presents for surgical treatment of left carpal tunnel syndrome.  She denies any changes in medical history.  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   surgery at [facility] (left side only)   Past Surgical History:  Procedure Laterality Date   CARPAL TUNNEL RELEASE Right 02/13/2023   Procedure: RIGHT CARPAL TUNNEL RELEASE;  Surgeon: Tarry Kos, MD;  Location: [facility];  Service: Orthopedics;  Laterality: Right;   SLIPPED CAPITAL FEMORAL EPIPHYSIS PINNING  06/2008   Pinned at [facility]   Social History   Socioeconomic History   Marital status: Single    Spouse name: Not on file   Number of children: 0   Years of education: Not on file   Highest education level: Not on file  Occupational History   Not on file  Tobacco Use   Smoking status: Never   Smokeless tobacco: Never  Vaping Use   Vaping status: Never Used  Substance and Sexual Activity   Alcohol use: Yes    Comment: socially   Drug use: No   Sexual activity: Yes    Birth control/protection: OCP  Other Topics Concern   Not on file  Social History Narrative   Lives at home with grandmother. .  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     Social Determinants of Health   Financial Resource Strain: Not on file  Food Insecurity: Not on file  Transportation Needs: Not on file  Physical Activity: Not on file  Stress: Not on file  Social Connections: Not on file   Family History  Problem Relation Age of Onset   Diabetes Paternal Grandmother    Hypertension Paternal Grandmother    Kidney disease Maternal Uncle        cancer   Kidney disease Maternal Grandmother        cancer   Hypertension Maternal Grandmother     Thyroid disease Maternal Grandmother    Cancer Maternal Grandfather        fatty liver.   No Known Allergies Prior to Admission medications   Medication Sig Start Date End Date Taking? Authorizing Provider  HYDROcodone-acetaminophen (NORCO) 5-325 MG tablet Take 1 tablet by mouth 2 (two) times daily as needed. To be taken after surgery 03/11/23  Yes Jari Sportsman L, PA-C  ibuprofen (ADVIL) 800 MG tablet TAKE 1 TABLET EVERY 8 HOURS AS NEEDED FOR HEADACHE OR MILD TO MODERATE PAIN 11/28/22  Yes Brock Bad, MD  Norethindrone Acetate-Ethinyl Estrad-FE (BLISOVI 24 FE) 1-20 MG-MCG(24) tablet Take 1 tablet by mouth daily. 01/04/23  Yes Brock Bad, MD  Prenat-FeCbn-FeAsp-Meth-FA-DHA (PRENATE MINI) 18-0.6-0.4-350 MG CAPS Take 1 capsule by mouth at bedtime. 11/28/22  Yes Brock Bad, MD  RETIN-A 0.025 % cream Apply 1 Application topically daily. 09/07/22   [provider]     Positive ROS: All other systems have been reviewed and were otherwise negative with the exception of those mentioned in the HPI and as above.  Physical Exam: General: Alert, no acute distress Cardiovascular: No pedal edema Respiratory: No cyanosis, no use of accessory musculature GI: abdomen soft Skin: No lesions in the area of chief complaint Neurologic: Sensation intact distally Psychiatric:  Patient is competent for consent with normal mood and affect Lymphatic: no lymphedema  MUSCULOSKELETAL: exam stable  Assessment: left carpal tunnel syndrome  Plan: Plan for Procedure(s): LEFT CARPAL TUNNEL RELEASE  The risks benefits and alternatives were discussed with the patient including but not limited to the risks of nonoperative treatment, versus surgical intervention including infection, bleeding, nerve injury,  blood clots, cardiopulmonary complications, morbidity, mortality, among others, and they were willing to proceed.   Glee Arvin, MD 03/27/2023 10:48 AM

## 2023-03-27 NOTE — Anesthesia Preprocedure Evaluation (Addendum)
Anesthesia Evaluation  Patient identified by MRN, date of birth, ID band Patient awake    Reviewed: Allergy & Precautions, NPO status , Patient's Chart, lab work & pertinent test results  Airway Mallampati: II  TM Distance: >3 FB Neck ROM: Full    Dental no notable dental hx.    Pulmonary neg pulmonary ROS   Pulmonary exam normal        Cardiovascular negative cardio ROS Normal cardiovascular exam     Neuro/Psych  Neuromuscular disease  negative psych ROS   GI/Hepatic negative GI ROS, Neg liver ROS,,,  Endo/Other  negative endocrine ROS    Renal/GU negative Renal ROS     Musculoskeletal negative musculoskeletal ROS (+)    Abdominal   Peds  Hematology negative hematology ROS (+)   Anesthesia Other Findings left carpal tunnel syndrome  Reproductive/Obstetrics Hcg negative                             Anesthesia Physical Anesthesia Plan  ASA: 1  Anesthesia Plan: MAC   Post-op Pain Management:    Induction: Intravenous  PONV Risk Score and Plan: 2 and Ondansetron, Dexamethasone, Propofol infusion, Midazolam and Treatment may vary due to age or medical condition  Airway Management Planned: Simple Face Mask  Additional Equipment:   Intra-op Plan:   Post-operative Plan:   Informed Consent: I have reviewed the patients History and Physical, chart, labs and discussed the procedure including the risks, benefits and alternatives for the proposed anesthesia with the patient or authorized representative who has indicated his/her understanding and acceptance.     Dental advisory given  Plan Discussed with: CRNA  Anesthesia Plan Comments:        Anesthesia Quick Evaluation

## 2023-03-27 NOTE — Discharge Instructions (Addendum)
Postoperative instructions:  Weightbearing instructions: don't lift more than 10 lbs for 4 weeks  Dressing instructions: Keep your dressing and/or splint clean and dry at all times.  It will be removed at your first post-operative appointment.  Your stitches and/or staples will be removed at this visit.  Incision instructions:  Do not soak your incision for 3 weeks after surgery.  If the incision gets wet, pat dry and do not scrub the incision.  Pain control:  You have been given a prescription to be taken as directed for post-operative pain control.  In addition, elevate the operative extremity above the heart at all times to prevent swelling and throbbing pain.  Take over-the-counter Colace, 100mg  by mouth twice a day while taking narcotic pain medications to help prevent constipation.  Follow up appointments: 1) 10 days for suture removal and wound check. 2) Dr. Roda Shutters as scheduled.   -------------------------------------------------------------------------------------------------------------  After Surgery Pain Control:  After your surgery, post-surgical discomfort or pain is likely. This discomfort can last several days to a few weeks. At certain times of the day your discomfort may be more intense.  Did you receive a nerve block?  A nerve block can provide pain relief for one hour to two days after your surgery. As long as the nerve block is working, you will experience little or no sensation in the area the surgeon operated on.  As the nerve block wears off, you will begin to experience pain or discomfort. It is very important that you begin taking your prescribed pain medication before the nerve block fully wears off. Treating your pain at the first sign of the block wearing off will ensure your pain is better controlled and more tolerable when full-sensation returns. Do not wait until the pain is intolerable, as the medicine will be less effective. It is better to treat pain in  advance than to try and catch up.  General Anesthesia:  If you did not receive a nerve block during your surgery, you will need to start taking your pain medication shortly after your surgery and should continue to do so as prescribed by your surgeon.  Pain Medication:  Most commonly we prescribe Vicodin and Percocet for post-operative pain. Both of these medications contain a combination of acetaminophen (Tylenol) and a narcotic to help control pain.   It takes between 30 and 45 minutes before pain medication starts to work. It is important to take your medication before your pain level gets too intense.   Nausea is a common side effect of many pain medications. You will want to eat something before taking your pain medicine to help prevent nausea.   If you are taking a prescription pain medication that contains acetaminophen, we recommend that you do not take additional over the counter acetaminophen (Tylenol).  Other pain relieving options:   Using a cold pack to ice the affected area a few times a day (15 to 20 minutes at a time) can help to relieve pain, reduce swelling and bruising.   Elevation of the affected area can also help to reduce pain and swelling.   Post Anesthesia Home Care Instructions  Activity: Get plenty of rest for the remainder of the day. A responsible individual must stay with you for 24 hours following the procedure.  For the next 24 hours, DO NOT: -Drive a car -Advertising copywriter -Drink alcoholic beverages -Take any medication unless instructed by your physician -Make any legal decisions or sign important papers.  Meals: Start with  liquid foods such as gelatin or soup. Progress to regular foods as tolerated. Avoid greasy, spicy, heavy foods. If nausea and/or vomiting occur, drink only clear liquids until the nausea and/or vomiting subsides. Call your physician if vomiting continues.  Special Instructions/Symptoms: Your throat may feel dry or sore from the  anesthesia or the breathing tube placed in your throat during surgery. If this causes discomfort, gargle with warm salt water. The discomfort should disappear within 24 hours.  If you had a scopolamine patch placed behind your ear for the management of post- operative nausea and/or vomiting:  1. The medication in the patch is effective for 72 hours, after which it should be removed.  Wrap patch in a tissue and discard in the trash. Wash hands thoroughly with soap and water. 2. You may remove the patch earlier than 72 hours if you experience unpleasant side effects which may include dry mouth, dizziness or visual disturbances. 3. Avoid touching the patch. Wash your hands with soap and water after contact with the patch.  No tylenol until after 6:15pm today if needed.

## 2023-03-27 NOTE — Anesthesia Postprocedure Evaluation (Signed)
Anesthesia Post Note  Patient: Olivia Vasquez  Procedure(s) Performed: LEFT CARPAL TUNNEL RELEASE (Left: Wrist)     Patient location during evaluation: PACU Anesthesia Type: MAC Level of consciousness: awake Pain management: pain level controlled Vital Signs Assessment: post-procedure vital signs reviewed and stable Respiratory status: spontaneous breathing, nonlabored ventilation and respiratory function stable Cardiovascular status: blood pressure returned to baseline and stable Postop Assessment: no apparent nausea or vomiting Anesthetic complications: no   No notable events documented.  Last Vitals:  Vitals:   03/27/23 1315 03/27/23 1337  BP: 120/75 120/78  Pulse: 63 62  Resp: 15 16  Temp:  36.6 C  SpO2: 97% 97%    Last Pain:  Vitals:   03/27/23 1337  TempSrc: Temporal  PainSc: 0-No pain                 Joane Postel P Emberlee Sortino

## 2023-03-27 NOTE — Op Note (Signed)
   Carpal tunnel op note  DATE OF SURGERY:03/27/2023  PREOPERATIVE DIAGNOSIS:  Left carpal tunnel syndrome  POSTOPERATIVE DIAGNOSIS: same  PROCEDURE: Left carpal tunnel release. CPT 40981  SURGEON: Cheral Almas, M.D.  ASSIST: Oneal Grout, [place]  ANESTHESIA:  Local and MAC  TOURNIQUET TIME: less than 20 minutes  BLOOD LOSS: Minimal.  COMPLICATIONS: None.  PATHOLOGY: None.  INDICATIONS: The patient is a 22 y.o. -year-old female who presented with carpal tunnel syndrome failing nonsurgical management, indicated for surgical release.  DESCRIPTION OF PROCEDURE: The patient was identified in the preoperative holding area.  The operative site was marked by the surgeon and confirmed by the patient.  The patient was brought back to the operating room.  MAC anesthesia was administered.  Local anesthetic with epi was injected into the operative site.  A well padded nonsterile tourniquet was placed. The operative extremity was prepped and draped in standard sterile fashion.  A timeout was performed.  Preoperative antibiotics were given.   A palmar incision was made about 5 mm ulnar to the thenar crease.  The palmar aponeurosis was exposed and divided in line with the skin incision. The palmaris brevis was visualized and divided.  The distal edge of the transcarpal ligament was identified. A hemostat was inserted into the carpal tunnel to protect the median nerve and the flexor tendons. Then, the transverse carpal ligament was released under direct visualization. Proximally, a subcutaneous tunnel was made allowing a Sewell retractor to be placed. Then, the distal portion of the antebrachial fascia was released. Distally, all fibrous bands were released. The median nerve was visualized, and the fat pad was exposed. Wound was irrigated and closed with 4-0 nylon sutures. Sterile dressing applied. The patient was transferred to the recovery room in stable condition after all counts were  correct.  POSTOPERATIVE PLAN: To start nerve gliding exercises as tolerated and no heavy lifting for four weeks.  Glee Arvin, M.D. [facility] 12:33 PM

## 2023-03-28 ENCOUNTER — Encounter (HOSPITAL_BASED_OUTPATIENT_CLINIC_OR_DEPARTMENT_OTHER): Payer: Self-pay | Admitting: Orthopaedic Surgery

## 2023-04-05 ENCOUNTER — Ambulatory Visit (INDEPENDENT_AMBULATORY_CARE_PROVIDER_SITE_OTHER): Payer: Medicaid Other | Admitting: Physician Assistant

## 2023-04-05 ENCOUNTER — Encounter: Payer: Self-pay | Admitting: Physician Assistant

## 2023-04-05 DIAGNOSIS — Z9889 Other specified postprocedural states: Secondary | ICD-10-CM

## 2023-04-05 DIAGNOSIS — G5602 Carpal tunnel syndrome, left upper limb: Secondary | ICD-10-CM

## 2023-04-05 MED ORDER — HYDROCODONE-ACETAMINOPHEN 5-325 MG PO TABS: 1.0000 | ORAL_TABLET | Freq: Two times a day (BID) | ORAL | 0 refills | Status: DC | PRN

## 2023-04-05 NOTE — Progress Notes (Signed)
Post-Op Visit Note   Patient: Olivia Vasquez           Date of Birth: 02-Mar-2001           MRN: [id number] Visit Date: 04/05/2023 PCP: [facility]   Assessment & Plan:  Chief Complaint:  Chief Complaint  Patient presents with   Left Wrist - Follow-up    Left carpal tunnel release 03/27/2023   Visit Diagnoses:  1. Carpal tunnel syndrome on left   2. S/P carpal tunnel release     Plan: Patient is a pleasant 22 year old female who comes in today 1 week status post left carpal tunnel release 03/27/2023.  She is still in some pain which is worse at night.  She denies any paresthesias.  She is taking Norco which does help.  Examination of her left wrist reveals a well-healing surgical incision with nylon sutures in place.  No signs of infection or cellulitis.  Fingers warm well-perfused.  She is neurovascular intact distally.  Today, her wound was cleaned and recovered.  Removable Velcro splint applied for which she will wear at all times for the next week.  No heavy lifting or submerging her hand underwater for 3 more weeks.  I refilled her Norco.  Follow-up next week for suture removal.  Call with concerns or questions.  Follow-Up Instructions: Return in about 1 week (around 04/12/2023).   Orders:  No orders of the defined types were placed in this encounter.  No orders of the defined types were placed in this encounter.   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    Past Surgical History:  Procedure Laterality Date   CARPAL TUNNEL RELEASE Right 02/13/2023   Procedure: RIGHT CARPAL TUNNEL RELEASE;  Surgeon: Tarry Kos, MD;  Location: [facility];  Service: Orthopedics;  Laterality: Right;   CARPAL TUNNEL RELEASE Left 03/27/2023   Procedure: LEFT CARPAL TUNNEL RELEASE;  Surgeon: Tarry Kos, MD;  Location: [facility];  Service: Orthopedics;  Laterality: Left;   SLIPPED CAPITAL FEMORAL EPIPHYSIS PINNING  06/2008   Pinned at [facility]   Social History   Occupational History   Not on file  Tobacco Use   Smoking status: Never   Smokeless tobacco: Never  Vaping Use   Vaping status: Never Used  Substance and Sexual Activity   Alcohol use: Yes    Comment: socially   Drug  use: No   Sexual activity: Yes    Birth control/protection: OCP

## 2023-04-10 ENCOUNTER — Encounter: Payer: Self-pay | Admitting: Physician Assistant

## 2023-04-10 ENCOUNTER — Ambulatory Visit: Payer: Medicaid Other | Admitting: Physician Assistant

## 2023-04-10 DIAGNOSIS — G5602 Carpal tunnel syndrome, left upper limb: Secondary | ICD-10-CM

## 2023-04-10 NOTE — Progress Notes (Signed)
   Post-Op Visit Note   Patient: Olivia Vasquez           Date of Birth: 2001-05-28           MRN: [id number] Visit Date: 04/10/2023 PCP: [facility], [facility]   Assessment & Plan:  Chief Complaint:  Chief Complaint  Patient presents with   Left Wrist - Follow-up    Left carpal tunnel release 03/27/2023   Visit Diagnoses:  1. Carpal tunnel syndrome on left     Plan: Patient is a pleasant 22 year old female who comes in today 2 weeks status post left carpal tunnel release 03/27/2023.  She has been doing well.  No paresthesias.  No complaints.  Examination of the left hand reveals a well-healed surgical incision with nylon sutures in place.  No evidence of infection or cellulitis.  Fingers are warm well-perfused.  She is neurovascularly intact distally.  Today, sutures were removed and Steri-Strips applied.  Continue with nerve gliding exercises.  No heavy lifting or submerging her hand underwater for 2 more weeks.  Follow-up in 2 weeks for recheck as well as to determine if she is ready to go back to work.  Call with concerns or questions in the meantime.  Follow-Up Instructions: Return in about 2 weeks (around 04/24/2023).   Orders:  No orders of the defined types were placed in this encounter.  No orders of the defined types were placed in this encounter.   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    Past Surgical History:  Procedure Laterality Date   CARPAL TUNNEL RELEASE Right 02/13/2023   Procedure: RIGHT CARPAL TUNNEL RELEASE;  Surgeon: Tarry Kos, MD;  Location: [facility];  Service: Orthopedics;  Laterality: Right;   CARPAL TUNNEL RELEASE Left 03/27/2023   Procedure: LEFT CARPAL TUNNEL RELEASE;  Surgeon: Tarry Kos, MD;  Location: [facility];  Service: Orthopedics;  Laterality: Left;   SLIPPED CAPITAL FEMORAL EPIPHYSIS PINNING  06/2008   Pinned at [facility]   Social History   Occupational History   Not on file  Tobacco Use   Smoking status: Never   Smokeless tobacco: Never  Vaping Use   Vaping status: Never Used  Substance and Sexual Activity   Alcohol use: Yes    Comment: socially   Drug use: No   Sexual activity: Yes    Birth control/protection: OCP

## 2023-04-18 ENCOUNTER — Telehealth: Payer: Medicaid Other | Admitting: Nurse Practitioner

## 2023-04-18 DIAGNOSIS — R051 Acute cough: Secondary | ICD-10-CM

## 2023-04-18 DIAGNOSIS — J4521 Mild intermittent asthma with (acute) exacerbation: Secondary | ICD-10-CM | POA: Diagnosis not present

## 2023-04-18 MED ORDER — BENZONATATE 100 MG PO CAPS
100.0000 mg | ORAL_CAPSULE | Freq: Three times a day (TID) | ORAL | 0 refills | Status: DC | PRN
Start: 2023-04-18 — End: 2024-03-02

## 2023-04-18 MED ORDER — ALBUTEROL SULFATE HFA 108 (90 BASE) MCG/ACT IN AERS
2.0000 | INHALATION_SPRAY | Freq: Four times a day (QID) | RESPIRATORY_TRACT | 0 refills | Status: DC | PRN
Start: 2023-04-18 — End: 2024-03-02

## 2023-04-18 NOTE — Progress Notes (Signed)
E-Visit for Cough  We are sorry that you are not feeling well.  Here is how we plan to help!  Your symptoms are consistent with COVID and you may want to consider taking a home COVID test if you are able.   Based on your presentation I believe you most likely have A cough due to a virus.  This is called viral bronchitis and is best treated by rest, plenty of fluids and control of the cough.  You may use Ibuprofen or Tylenol as directed to help your symptoms.     In addition you may use A prescription cough medication called Tessalon Perles 100mg . You may take 1-2 capsules every 8 hours as needed for your cough.  We will refill your Albuterol inhaler and advise you are using this, if you are not getting relief from your inhaler you should be seen in person for evaluation.   Meds ordered this encounter  Medications   benzonatate (TESSALON) 100 MG capsule    Sig: Take 1 capsule (100 mg total) by mouth 3 (three) times daily as needed.    Dispense:  30 capsule    Refill:  0   albuterol (VENTOLIN HFA) 108 (90 Base) MCG/ACT inhaler    Sig: Inhale 2 puffs into the lungs every 6 (six) hours as needed for wheezing or shortness of breath.    Dispense:  8 g    Refill:  0    From your responses in the eVisit questionnaire you describe inflammation in the upper respiratory tract which is causing a significant cough.  This is commonly called Bronchitis and has four common causes:   Allergies Viral Infections Acid Reflux Bacterial Infection Allergies, viruses and acid reflux are treated by controlling symptoms or eliminating the cause. An example might be a cough caused by taking certain blood pressure medications. You stop the cough by changing the medication. Another example might be a cough caused by acid reflux. Controlling the reflux helps control the cough.  USE OF BRONCHODILATOR ("RESCUE") INHALERS: There is a risk from using your bronchodilator too frequently.  The risk is that over-reliance  on a medication which only relaxes the muscles surrounding the breathing tubes can reduce the effectiveness of medications prescribed to reduce swelling and congestion of the tubes themselves.  Although you feel brief relief from the bronchodilator inhaler, your asthma may actually be worsening with the tubes becoming more swollen and filled with mucus.  This can delay other crucial treatments, such as oral steroid medications. If you need to use a bronchodilator inhaler daily, several times per day, you should discuss this with your provider.  There are probably better treatments that could be used to keep your asthma under control.     HOME CARE Only take medications as instructed by your medical team. Complete the entire course of an antibiotic. Drink plenty of fluids and get plenty of rest. Avoid close contacts especially the very young and the elderly Cover your mouth if you cough or cough into your sleeve. Always remember to wash your hands A steam or ultrasonic humidifier can help congestion.   GET HELP RIGHT AWAY IF: You develop worsening fever. You become short of breath You cough up blood. Your symptoms persist after you have completed your treatment plan MAKE SURE YOU  Understand these instructions. Will watch your condition. Will get help right away if you are not doing well or get worse.    Thank you for choosing an e-visit.  Your e-visit answers  were reviewed by a board certified advanced clinical practitioner to complete your personal care plan. Depending upon the condition, your plan could have included both over the counter or prescription medications.  Please review your pharmacy choice. Make sure the pharmacy is open so you can pick up prescription now. If there is a problem, you may contact your provider through [employer] and have the prescription routed to another pharmacy.  Your safety is important to [place]. If you have drug allergies check your prescription  carefully.   For the next 24 hours you can use MyChart to ask questions about today's visit, request a non-urgent call back, or ask for a work or school excuse. You will get an email in the next two days asking about your experience. I hope that your e-visit has been valuable and will speed your recovery.   I spent approximately 5 minutes reviewing the patient's history, current symptoms and coordinating their care today.

## 2023-04-19 ENCOUNTER — Ambulatory Visit: Payer: Medicaid Other | Admitting: Orthopaedic Surgery

## 2023-04-23 ENCOUNTER — Encounter: Payer: Self-pay | Admitting: Physician Assistant

## 2023-04-23 ENCOUNTER — Ambulatory Visit (INDEPENDENT_AMBULATORY_CARE_PROVIDER_SITE_OTHER): Payer: Medicaid Other | Admitting: Physician Assistant

## 2023-04-23 DIAGNOSIS — G5602 Carpal tunnel syndrome, left upper limb: Secondary | ICD-10-CM

## 2023-04-23 DIAGNOSIS — Z9889 Other specified postprocedural states: Secondary | ICD-10-CM

## 2023-04-23 NOTE — Progress Notes (Signed)
   Post-Op Visit Note   Patient: Olivia Vasquez           Date of Birth: 2001-05-10           MRN: [id number] Visit Date: 04/23/2023 PCP: [facility]   Assessment & Plan:  Chief Complaint:  Chief Complaint  Patient presents with   Left Wrist - Follow-up    Carpal tunnel release 03/27/2023   Visit Diagnoses:  1. Carpal tunnel syndrome on left   2. S/P carpal tunnel release     Plan: Patient is a pleasant 22 year old female who comes in today 4 weeks status post left carpal tunnel release 03/27/2023.  She still notes some sensitivity around the incision but denies any paresthesias.  Overall, doing much better.  Examination of the left wrist reveals a fully healed surgical scar without complication.  Fingers warm well-perfused.  She is neurovascular intact distally.  At this point, she may begin desensitization with lotion as I think this will help.  She may return to work full duty next Wednesday, 05/01/2023.  She will follow-up with [place] as needed.  Call with concerns or questions.  Follow-Up Instructions: Return if symptoms worsen or fail to improve.   Orders:  No orders of the defined types were placed in this encounter.  No orders of the defined types were placed in this encounter.   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    Past Surgical History:  Procedure Laterality Date   CARPAL TUNNEL RELEASE Right 02/13/2023   Procedure: RIGHT CARPAL TUNNEL RELEASE;  Surgeon: Tarry Kos, MD;  Location: [facility];  Service: Orthopedics;  Laterality: Right;   CARPAL TUNNEL RELEASE Left 03/27/2023   Procedure: LEFT CARPAL TUNNEL RELEASE;  Surgeon: Tarry Kos, MD;  Location: [facility];  Service: Orthopedics;  Laterality: Left;   SLIPPED CAPITAL FEMORAL EPIPHYSIS PINNING  06/2008   Pinned at [facility]   Social History   Occupational History   Not on file  Tobacco Use   Smoking status: Never   Smokeless tobacco: Never  Vaping Use   Vaping status: Never Used  Substance and Sexual Activity   Alcohol use: Yes    Comment: socially   Drug use: No   Sexual activity: Yes    Birth control/protection: OCP

## 2023-05-07 ENCOUNTER — Other Ambulatory Visit: Payer: Self-pay | Admitting: Physician Assistant

## 2023-06-29 ENCOUNTER — Telehealth: Payer: Medicaid Other | Admitting: Family Medicine

## 2023-06-29 DIAGNOSIS — N390 Urinary tract infection, site not specified: Secondary | ICD-10-CM

## 2023-06-29 MED ORDER — NITROFURANTOIN MONOHYD MACRO 100 MG PO CAPS
100.0000 mg | ORAL_CAPSULE | Freq: Two times a day (BID) | ORAL | 0 refills | Status: AC
Start: 2023-06-29 — End: 2023-07-04

## 2023-06-29 NOTE — Progress Notes (Signed)
E-Visit for Urinary Problems  We are sorry that you are not feeling well.  Here is how we plan to help!  Based on what you shared with me it looks like you most likely have a simple urinary tract infection.  A UTI (Urinary Tract Infection) is a bacterial infection of the bladder.  Most cases of urinary tract infections are simple to treat but a key part of your care is to encourage you to drink plenty of fluids and watch your symptoms carefully.  I have prescribed MacroBid 100 mg twice a day for 5 days.  Your symptoms should gradually improve. Call us if the burning in your urine worsens, you develop worsening fever, back pain or pelvic pain or if your symptoms do not resolve after completing the antibiotic.  Urinary tract infections can be prevented by drinking plenty of water to keep your body hydrated.  Also be sure when you wipe, wipe from front to back and don't hold it in!  If possible, empty your bladder every 4 hours.  HOME CARE Drink plenty of fluids Compete the full course of the antibiotics even if the symptoms resolve Remember, when you need to go.go. Holding in your urine can increase the likelihood of getting a UTI! GET HELP RIGHT AWAY IF: You cannot urinate You get a high fever Worsening back pain occurs You see blood in your urine You feel sick to your stomach or throw up You feel like you are going to pass out  MAKE SURE YOU  Understand these instructions. Will watch your condition. Will get help right away if you are not doing well or get worse.   Thank you for choosing an e-visit.  Your e-visit answers were reviewed by a board certified advanced clinical practitioner to complete your personal care plan. Depending upon the condition, your plan could have included both over the counter or prescription medications.  Please review your pharmacy choice. Make sure the pharmacy is open so you can pick up prescription now. If there is a problem, you may contact your  provider through [employer] and have the prescription routed to another pharmacy.  Your safety is important to [place]. If you have drug allergies check your prescription carefully.   For the next 24 hours you can use MyChart to ask questions about today's visit, request a non-urgent call back, or ask for a work or school excuse. You will get an email in the next two days asking about your experience. I hope that your e-visit has been valuable and will speed your recovery.  I have spent 5 minutes in review of e-visit questionnaire, review and updating patient chart, medical decision making and response to patient.   Reed Pandy, PA-C

## 2023-07-05 ENCOUNTER — Ambulatory Visit: Payer: Medicaid Other

## 2023-07-29 ENCOUNTER — Ambulatory Visit: Payer: Medicaid Other

## 2023-07-30 ENCOUNTER — Other Ambulatory Visit (HOSPITAL_COMMUNITY)
Admission: RE | Admit: 2023-07-30 | Discharge: 2023-07-30 | Disposition: A | Discharge status: Home / Self Care | Payer: Medicaid Other | Source: Ambulatory Visit | Attending: Obstetrics and Gynecology | Admitting: Obstetrics and Gynecology

## 2023-07-30 ENCOUNTER — Ambulatory Visit: Payer: Medicaid Other

## 2023-07-30 VITALS — BP 130/77 | HR 81 | Wt 176.0 lb

## 2023-07-30 DIAGNOSIS — Z113 Encounter for screening for infections with a predominantly sexual mode of transmission: Secondary | ICD-10-CM | POA: Insufficient documentation

## 2023-07-30 DIAGNOSIS — Z23 Encounter for immunization: Secondary | ICD-10-CM

## 2023-07-30 NOTE — Progress Notes (Signed)
Pt is in the office for 3rd Gardasil injection. Administered in [place] and pt tolerated well.   ..SUBJECTIVE:  22 y.o. female requests STD testing today Denies abnormal vaginal bleeding or significant pelvic pain or fever. No UTI symptoms. Denies history of known exposure to STD.  Patient's last menstrual period was 07/13/2023.  OBJECTIVE:  She appears well, afebrile. Urine dipstick: not done.  ASSESSMENT:  STD screening  PLAN:  GC, chlamydia, trichomonas, BVAG, CVAG probe sent to lab. Treatment: To be determined once lab results are received ROV prn if symptoms persist or worsen.

## 2023-07-31 LAB — RPR: RPR Ser Ql: NONREACTIVE

## 2023-07-31 LAB — CERVICOVAGINAL ANCILLARY ONLY
Bacterial Vaginitis (gardnerella): NEGATIVE
Candida Glabrata: NEGATIVE
Candida Vaginitis: NEGATIVE
Chlamydia: NEGATIVE
Comment: NEGATIVE
Comment: NEGATIVE
Comment: NEGATIVE
Comment: NEGATIVE
Comment: NEGATIVE
Comment: NORMAL
Neisseria Gonorrhea: NEGATIVE
Trichomonas: NEGATIVE

## 2023-07-31 LAB — HEPATITIS B SURFACE ANTIGEN: Hepatitis B Surface Ag: NEGATIVE

## 2023-07-31 LAB — HIV ANTIBODY (ROUTINE TESTING W REFLEX): HIV Screen 4th Generation wRfx: NONREACTIVE

## 2023-07-31 LAB — HEPATITIS C ANTIBODY: Hep C Virus Ab: NONREACTIVE

## 2023-09-28 ENCOUNTER — Other Ambulatory Visit: Payer: Self-pay

## 2023-09-28 ENCOUNTER — Emergency Department: Payer: Medicaid Other

## 2023-09-28 ENCOUNTER — Emergency Department
Admission: EM | Admit: 2023-09-28 | Discharge: 2023-09-28 | Disposition: A | Discharge status: Home / Self Care | Payer: Medicaid Other | Attending: Emergency Medicine | Admitting: Emergency Medicine

## 2023-09-28 DIAGNOSIS — R0789 Other chest pain: Secondary | ICD-10-CM | POA: Insufficient documentation

## 2023-09-28 LAB — CBC WITH DIFFERENTIAL/PLATELET
Abs Immature Granulocytes: 0.02 10*3/uL (ref 0.00–0.07)
Basophils Absolute: 0 10*3/uL (ref 0.0–0.1)
Basophils Relative: 0 %
Eosinophils Absolute: 0.1 10*3/uL (ref 0.0–0.5)
Eosinophils Relative: 1 %
HCT: 40.6 % (ref 36.0–46.0)
Hemoglobin: 13.8 g/dL (ref 12.0–15.0)
Immature Granulocytes: 0 %
Lymphocytes Relative: 5 %
Lymphs Abs: 0.3 10*3/uL — ABNORMAL LOW (ref 0.7–4.0)
MCH: 32.3 pg (ref 26.0–34.0)
MCHC: 34 g/dL (ref 30.0–36.0)
MCV: 95.1 fL (ref 80.0–100.0)
Monocytes Absolute: 0.6 10*3/uL (ref 0.1–1.0)
Monocytes Relative: 11 %
Neutro Abs: 4.8 10*3/uL (ref 1.7–7.7)
Neutrophils Relative %: 83 %
Platelets: 330 10*3/uL (ref 150–400)
RBC: 4.27 MIL/uL (ref 3.87–5.11)
RDW: 13 % (ref 11.5–15.5)
WBC: 5.8 10*3/uL (ref 4.0–10.5)
nRBC: 0 % (ref 0.0–0.2)

## 2023-09-28 LAB — URINALYSIS, ROUTINE W REFLEX MICROSCOPIC
Bilirubin Urine: NEGATIVE
Glucose, UA: NEGATIVE mg/dL
Ketones, ur: 20 mg/dL — AB
Leukocytes,Ua: NEGATIVE
Nitrite: NEGATIVE
Protein, ur: 30 mg/dL — AB
RBC / HPF: >50 RBC/hpf (ref 0–5)
Specific Gravity, Urine: 1.029 (ref 1.005–1.030)
pH: 5 (ref 5.0–8.0)

## 2023-09-28 LAB — BASIC METABOLIC PANEL
Anion gap: 14 (ref 5–15)
BUN: 11 mg/dL (ref 6–20)
CO2: 20 mmol/L — ABNORMAL LOW (ref 22–32)
Calcium: 9.3 mg/dL (ref 8.9–10.3)
Chloride: 105 mmol/L (ref 98–111)
Creatinine, Ser: 0.6 mg/dL (ref 0.44–1.00)
GFR, Estimated: >60 mL/min (ref >60)
Glucose, Bld: 81 mg/dL (ref 70–99)
Potassium: 3.6 mmol/L (ref 3.5–5.1)
Sodium: 139 mmol/L (ref 135–145)

## 2023-09-28 LAB — URINE DRUG SCREEN, QUALITATIVE (ARMC ONLY)
Amphetamines, Ur Screen: NOT DETECTED
Barbiturates, Ur Screen: NOT DETECTED
Benzodiazepine, Ur Scrn: NOT DETECTED
Cannabinoid 50 Ng, Ur ~~LOC~~: NOT DETECTED
Cocaine Metabolite,Ur ~~LOC~~: NOT DETECTED
MDMA (Ecstasy)Ur Screen: NOT DETECTED
Methadone Scn, Ur: NOT DETECTED
Opiate, Ur Screen: NOT DETECTED
Phencyclidine (PCP) Ur S: NOT DETECTED
Tricyclic, Ur Screen: NOT DETECTED

## 2023-09-28 LAB — TROPONIN I (HIGH SENSITIVITY): Troponin I (High Sensitivity): 2 ng/L (ref <18)

## 2023-09-28 LAB — PREGNANCY, URINE: Preg Test, Ur: NEGATIVE

## 2023-09-28 MED ORDER — KETOROLAC TROMETHAMINE 30 MG/ML IJ SOLN
30.0000 mg | Freq: Once | INTRAMUSCULAR | Status: AC
Start: 2023-09-28 — End: 2023-09-28
  Administered 2023-09-28: 30 mg via INTRAMUSCULAR
  Filled 2023-09-28: qty 1

## 2023-09-28 MED ORDER — ONDANSETRON 4 MG PO TBDP
4.0000 mg | ORAL_TABLET | Freq: Once | ORAL | Status: AC
  Administered 2023-09-28: 4 mg via ORAL
  Filled 2023-09-28: qty 1

## 2023-09-28 MED ORDER — ACETAMINOPHEN 500 MG PO TABS
1000.0000 mg | ORAL_TABLET | Freq: Once | ORAL | Status: AC
  Administered 2023-09-28: 1000 mg via ORAL
  Filled 2023-09-28: qty 2

## 2023-09-28 MED ORDER — ONDANSETRON 4 MG PO TBDP: 4.0000 mg | ORAL_TABLET | Freq: Three times a day (TID) | ORAL | 0 refills | Status: AC | PRN
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
## 2024-01-01 ENCOUNTER — Other Ambulatory Visit: Payer: Self-pay | Admitting: Obstetrics

## 2024-01-01 DIAGNOSIS — N946 Dysmenorrhea, unspecified: Secondary | ICD-10-CM

## 2024-01-05 ENCOUNTER — Telehealth: Admitting: Physician Assistant

## 2024-01-05 ENCOUNTER — Encounter: Payer: Self-pay | Admitting: Physician Assistant

## 2024-01-05 DIAGNOSIS — K0889 Other specified disorders of teeth and supporting structures: Secondary | ICD-10-CM | POA: Diagnosis not present

## 2024-01-05 MED ORDER — PENICILLIN V POTASSIUM 500 MG PO TABS: 500.0000 mg | ORAL_TABLET | Freq: Three times a day (TID) | ORAL | 0 refills | Status: AC

## 2024-01-05 NOTE — Progress Notes (Signed)
 Because your recent addition of braces, I feel your condition warrants further evaluation and I recommend that you be seen for a face to face visit.  Please contact your orthodontist to be seen.   NOTE: You will NOT be charged for this eVisit.   If you are having a true medical emergency please call 911.   Your e-visit answers were reviewed by a board certified advanced clinical practitioner to complete your personal care plan.  Thank you for using e-Visits.  I have spent 5 minutes in review of e-visit questionnaire, review and updating patient chart, medical decision making and response to patient.   Etter Hermann Mayers, PA-C

## 2024-01-05 NOTE — Progress Notes (Signed)

## 2024-01-05 NOTE — Addendum Note (Signed)
 Addended by: Malcom Scriver on: 01/05/2024 09:51 AM   Modules accepted: Orders, Level of Service

## 2024-02-05 ENCOUNTER — Ambulatory Visit: Admitting: Certified Nurse Midwife

## 2024-02-05 ENCOUNTER — Encounter: Payer: Self-pay | Admitting: Certified Nurse Midwife

## 2024-02-05 ENCOUNTER — Other Ambulatory Visit (HOSPITAL_COMMUNITY)
Admission: RE | Admit: 2024-02-05 | Discharge: 2024-02-05 | Disposition: A | Discharge status: Home / Self Care | Source: Ambulatory Visit | Attending: Certified Nurse Midwife | Admitting: Certified Nurse Midwife

## 2024-02-05 VITALS — BP 119/75 | HR 84 | Ht 63.0 in | Wt 165.0 lb

## 2024-02-05 DIAGNOSIS — Z113 Encounter for screening for infections with a predominantly sexual mode of transmission: Secondary | ICD-10-CM | POA: Diagnosis not present

## 2024-02-05 DIAGNOSIS — Z01419 Encounter for gynecological examination (general) (routine) without abnormal findings: Secondary | ICD-10-CM

## 2024-02-05 NOTE — Progress Notes (Signed)
 Patient presents for Annual.  LMP: 01/23/24 Monthly Moderate to heavy flow. Last pap: 11/28/2022 +HPV ASCUS  Contraception: None felt like BC caused weight gained. Stopped early this year. Mammogram: 10/23/2023 for Right Breast Mass Report in [facility] /care everywhere. F/U in 6 months Family Hx of Breast Cancer Maternal GGM  STD Screening: Accepts   CC: Annual/None

## 2024-02-06 LAB — RPR+HBSAG+HCVAB+...
HIV Screen 4th Generation wRfx: NONREACTIVE
Hep C Virus Ab: NONREACTIVE
Hepatitis B Surface Ag: NEGATIVE
RPR Ser Ql: NONREACTIVE

## 2024-02-06 NOTE — Progress Notes (Signed)
 GYNECOLOGY OFFICE VISIT NOTE  History:   Olivia Vasquez is a 23 y.o. G0P0000 here today for annual visit. She reports that she is sexually active but declines contraception use. She is currently being followed for an abnormal mammogram with follow up for 6 months. . She denies any abnormal vaginal discharge, bleeding, pelvic pain or other concerns.  She does request STD testing today.     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   surgery at [facility] (left side only)    Past Surgical History:  Procedure Laterality Date   CARPAL TUNNEL RELEASE Right 02/13/2023   Procedure: RIGHT CARPAL TUNNEL RELEASE;  Surgeon: Wes Hamman, MD;  Location: [facility];  Service: Orthopedics;  Laterality: Right;   CARPAL TUNNEL RELEASE Left 03/27/2023   Procedure: LEFT CARPAL TUNNEL RELEASE;  Surgeon: Wes Hamman, MD;  Location: [facility];  Service: Orthopedics;  Laterality: Left;   SLIPPED CAPITAL FEMORAL EPIPHYSIS PINNING  06/2008   Pinned at [facility]    The following portions of the patient's history were reviewed and updated as appropriate: allergies, current medications, past family history, past medical history, past social history, past surgical history and problem list.   Health Maintenance:  abnormal pap and positive HRHPV ASCUS  on 11/28/23.  Abnormal mammogram on 10/23/2023 . Stable right breast mass, likely fibroadenoma, probably benign.  BI-RADS Category: 3 - Probably benign findings.  RECOMMENDATIONS: Short Interval Follow-up  six-month diagnostic right breast ultrasound to ensure stability of probable fibroadenoma.  Review of Systems:  Pertinent items noted in HPI and remainder of comprehensive ROS otherwise negative.  Physical Exam:  BP 119/75   Pulse 84   Ht 5' 3 (1.6 m)    Wt 165 lb (74.8 kg)   BMI 29.23 kg/m  CONSTITUTIONAL: Well-developed, well-nourished female in no acute distress.  HEENT:  Normocephalic, atraumatic. External right and left ear normal. No scleral icterus.  NECK: Normal range of motion, supple, no masses noted on observation SKIN: No rash noted. Not diaphoretic. No erythema. No pallor. MUSCULOSKELETAL: Normal range of motion. No edema noted. NEUROLOGIC: Alert and oriented to person, place, and time. Normal muscle tone coordination. No cranial nerve deficit noted. PSYCHIATRIC: Normal mood and affect. Normal behavior. Normal judgment and thought content. CARDIOVASCULAR: Normal heart rate noted RESPIRATORY: Effort and breath sounds normal, no problems with respiration noted ABDOMEN: No masses noted. No other overt distention noted.   PELVIC: Normal appearing external genitalia; normal urethral meatus; normal appearing vaginal mucosa and cervix.  No abnormal discharge noted.  Normal uterine size, no other palpable masses, no uterine or adnexal tenderness. Performed in the presence of a chaperone  Labs and Imaging Results for orders placed or performed in visit on 02/05/24 (from the past week)  RPR+HBsAg+HCVAb+...   Collection Time: 02/05/24  4:46 PM  Result Value Ref Range   Hepatitis B Surface Ag Negative Negative   Hep C Virus Ab Non Reactive Non Reactive   RPR Ser Ql Non Reactive Non Reactive   Interpretation: Comment    HIV Screen 4th Generation wRfx Non Reactive Non Reactive   No results found.    Assessment and Plan:    1. Encounter for annual routine gynecological examination (Primary) - Patient doing well.  - Discussed options for abnormal breast  size for bras.   2. Screen for STD (sexually transmitted disease) - Cytology - PAP - RPR+HBsAg+HCVAb+...   Routine preventative health maintenance measures emphasized. Please refer to After Visit Summary for other counseling recommendations.   Return in about 1 year (around  02/04/2025) for Olivia Vasquez.    I spent 30 minutes dedicated to the care of this patient including pre-visit review of records, face to face time with the patient discussing her conditions and treatments and post visit orders.    Terryann Verbeek Maurie Southern) Marlys Singh, MSN, CNM  [facility]  02/06/24 4:32 PM

## 2024-02-12 ENCOUNTER — Ambulatory Visit: Payer: Self-pay | Admitting: Certified Nurse Midwife

## 2024-02-12 LAB — CYTOLOGY - PAP
Chlamydia: NEGATIVE
Comment: NEGATIVE
Comment: NEGATIVE
Comment: NEGATIVE
Comment: NEGATIVE
Comment: NEGATIVE
Comment: NORMAL
HPV 16: NEGATIVE
HPV 18 / 45: NEGATIVE
High risk HPV: POSITIVE — AB
Neisseria Gonorrhea: NEGATIVE
Trichomonas: NEGATIVE

## 2024-02-14 ENCOUNTER — Telehealth: Payer: Self-pay

## 2024-02-25 ENCOUNTER — Other Ambulatory Visit: Payer: Self-pay | Admitting: Obstetrics

## 2024-02-25 DIAGNOSIS — E569 Vitamin deficiency, unspecified: Secondary | ICD-10-CM

## 2024-03-02 ENCOUNTER — Ambulatory Visit (INDEPENDENT_AMBULATORY_CARE_PROVIDER_SITE_OTHER): Admitting: Obstetrics & Gynecology

## 2024-03-02 ENCOUNTER — Other Ambulatory Visit (HOSPITAL_COMMUNITY)
Admission: RE | Admit: 2024-03-02 | Discharge: 2024-03-02 | Disposition: A | Discharge status: Home / Self Care | Source: Ambulatory Visit | Attending: Obstetrics & Gynecology | Admitting: Obstetrics & Gynecology

## 2024-03-02 ENCOUNTER — Encounter: Payer: Self-pay | Admitting: Obstetrics & Gynecology

## 2024-03-02 VITALS — BP 129/86 | HR 79 | Wt 166.0 lb

## 2024-03-02 DIAGNOSIS — Z3202 Encounter for pregnancy test, result negative: Secondary | ICD-10-CM | POA: Diagnosis not present

## 2024-03-02 DIAGNOSIS — R87810 Cervical high risk human papillomavirus (HPV) DNA test positive: Secondary | ICD-10-CM

## 2024-03-02 DIAGNOSIS — R87612 Low grade squamous intraepithelial lesion on cytologic smear of cervix (LGSIL): Secondary | ICD-10-CM | POA: Diagnosis present

## 2024-03-02 LAB — POCT URINE PREGNANCY: Preg Test, Ur: NEGATIVE

## 2024-03-02 NOTE — Patient Instructions (Signed)
 COLPOSCOPY POST-PROCEDURE INSTRUCTIONS  You may take Ibuprofen, Aleve or Tylenol for cramping if needed.  If Monsel's solution was used, you will have a black discharge.  Light bleeding is normal.  If bleeding is heavier than your period, please call.  Put nothing in your vagina until the bleeding or discharge stops (usually 2 or3 days).  We will call you within one week with biopsy results

## 2024-03-02 NOTE — Progress Notes (Signed)
    GYNECOLOGY OFFICE COLPOSCOPY PROCEDURE NOTE  23 y.o. 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 Discussed role for HPV in cervical dysplasia, need for surveillance. Patient has received HPV vaccine series.  Patient gave informed written consent, time out was performed.  Urine pregnancy test negative. Patient was placed in lithotomy position. Cervix viewed with speculum and colposcope after application of acetic acid.   Colposcopy adequate? Yes Acetowhite lesion noted at 5-6 o'clock; corresponding biopsy obtained.  ECC specimen obtained. All specimens were labeled and sent to pathology.  Chaperone was present during entire procedure.  Patient was given post procedure instructions.  Will follow up pathology and manage accordingly; patient will be contacted with results and recommendations.  Routine preventative health maintenance measures emphasized.    GLORIS HUGGER, MD, FACOG Obstetrician & Gynecologist, [facility] for [employer], [facility]

## 2024-03-05 ENCOUNTER — Ambulatory Visit: Payer: Self-pay | Admitting: Obstetrics & Gynecology

## 2024-03-05 DIAGNOSIS — N870 Mild cervical dysplasia: Secondary | ICD-10-CM | POA: Insufficient documentation

## 2024-03-05 DIAGNOSIS — R87612 Low grade squamous intraepithelial lesion on cytologic smear of cervix (LGSIL): Secondary | ICD-10-CM

## 2024-03-05 LAB — SURGICAL PATHOLOGY

## 2024-03-10 ENCOUNTER — Telehealth: Admitting: Physician Assistant

## 2024-03-10 DIAGNOSIS — N760 Acute vaginitis: Secondary | ICD-10-CM

## 2024-03-10 DIAGNOSIS — N3090 Cystitis, unspecified without hematuria: Secondary | ICD-10-CM

## 2024-03-10 MED ORDER — FLUCONAZOLE 150 MG PO TABS: ORAL_TABLET | ORAL | 0 refills | Status: DC

## 2024-03-10 MED ORDER — CEPHALEXIN 500 MG PO CAPS: 500.0000 mg | ORAL_CAPSULE | Freq: Two times a day (BID) | ORAL | 0 refills | Status: AC

## 2024-03-10 NOTE — Progress Notes (Signed)
 E-Visit for Urinary Problems  We are sorry that you are not feeling well.  Here is how we plan to help!  Based on what you shared with me it looks like you most likely have a simple urinary tract infection.  A UTI (Urinary Tract Infection) is a bacterial infection of the bladder.  Most cases of urinary tract infections are simple to treat but a key part of your care is to encourage you to drink plenty of fluids and watch your symptoms carefully.  I have prescribed Keflex  500 mg twice a day for 7 days.  Your symptoms should gradually improve. Call us  if the burning in your urine worsens, you develop worsening fever, back pain or pelvic pain or if your symptoms do not resolve after completing the antibiotic.  I have also prescribed fluconazole   to treat a yeast infection  Urinary tract infections can be prevented by drinking plenty of water to keep your body hydrated.  Also be sure when you wipe, wipe from front to back and don't hold it in!  If possible, empty your bladder every 4 hours.  HOME CARE Drink plenty of fluids Compete the full course of the antibiotics even if the symptoms resolve Remember, when you need to go.go. Holding in your urine can increase the likelihood of getting a UTI! GET HELP RIGHT AWAY IF: You cannot urinate You get a high fever Worsening back pain occurs You see blood in your urine You feel sick to your stomach or throw up You feel like you are going to pass out  MAKE SURE YOU  Understand these instructions. Will watch your condition. Will get help right away if you are not doing well or get worse.   Thank you for choosing an e-visit.  Your e-visit answers were reviewed by a board certified advanced clinical practitioner to complete your personal care plan. Depending upon the condition, your plan could have included both over the counter or prescription medications.  Please review your pharmacy choice. Make sure the pharmacy is open so you can pick up  prescription now. If there is a problem, you may contact your provider through [employer] and have the prescription routed to another pharmacy.  Your safety is important to us . If you have drug allergies check your prescription carefully.   For the next 24 hours you can use MyChart to ask questions about today's visit, request a non-urgent call back, or ask for a work or school excuse. You will get an email in the next two days asking about your experience. I hope that your e-visit has been valuable and will speed your recovery.   Approximately 5 minutes was spent documenting and reviewing patient's chart.

## 2024-03-11 ENCOUNTER — Encounter: Admitting: Certified Nurse Midwife

## 2024-04-06 ENCOUNTER — Inpatient Hospital Stay (HOSPITAL_COMMUNITY)

## 2024-04-06 ENCOUNTER — Inpatient Hospital Stay (HOSPITAL_COMMUNITY)
Admission: AD | Admit: 2024-04-06 | Discharge: 2024-04-06 | Disposition: A | Discharge status: Home / Self Care | Attending: Obstetrics & Gynecology | Admitting: Obstetrics & Gynecology

## 2024-04-06 ENCOUNTER — Encounter (HOSPITAL_COMMUNITY): Payer: Self-pay | Admitting: Family Medicine

## 2024-04-06 DIAGNOSIS — Z3A01 Less than 8 weeks gestation of pregnancy: Secondary | ICD-10-CM

## 2024-04-06 DIAGNOSIS — O468X1 Other antepartum hemorrhage, first trimester: Secondary | ICD-10-CM

## 2024-04-06 DIAGNOSIS — O418X10 Other specified disorders of amniotic fluid and membranes, first trimester, not applicable or unspecified: Secondary | ICD-10-CM

## 2024-04-06 DIAGNOSIS — O209 Hemorrhage in early pregnancy, unspecified: Secondary | ICD-10-CM | POA: Insufficient documentation

## 2024-04-06 DIAGNOSIS — R109 Unspecified abdominal pain: Secondary | ICD-10-CM | POA: Insufficient documentation

## 2024-04-06 LAB — URINALYSIS, ROUTINE W REFLEX MICROSCOPIC
Bacteria, UA: NONE SEEN
Bilirubin Urine: NEGATIVE
Glucose, UA: NEGATIVE mg/dL
Ketones, ur: NEGATIVE mg/dL
Nitrite: NEGATIVE
Protein, ur: 30 mg/dL — AB
Specific Gravity, Urine: 1.032 — ABNORMAL HIGH (ref 1.005–1.030)
pH: 7 (ref 5.0–8.0)

## 2024-04-06 LAB — CBC
HCT: 37.5 % (ref 36.0–46.0)
Hemoglobin: 12.4 g/dL (ref 12.0–15.0)
MCH: 32 pg (ref 26.0–34.0)
MCHC: 33.1 g/dL (ref 30.0–36.0)
MCV: 96.9 fL (ref 80.0–100.0)
Platelets: 328 K/uL (ref 150–400)
RBC: 3.87 MIL/uL (ref 3.87–5.11)
RDW: 12.9 % (ref 11.5–15.5)
WBC: 8.2 K/uL (ref 4.0–10.5)
nRBC: 0 % (ref 0.0–0.2)

## 2024-04-06 LAB — WET PREP, GENITAL
Clue Cells Wet Prep HPF POC: NONE SEEN
Sperm: NONE SEEN
Trich, Wet Prep: NONE SEEN
WBC, Wet Prep HPF POC: >=10 — AB (ref <10)
Yeast Wet Prep HPF POC: NONE SEEN

## 2024-04-06 LAB — HCG, QUANTITATIVE, PREGNANCY: hCG, Beta Chain, Quant, S: 9737 m[IU]/mL — ABNORMAL HIGH (ref <5)

## 2024-04-06 LAB — POCT PREGNANCY, URINE: Preg Test, Ur: POSITIVE — AB

## 2024-04-06 NOTE — MAU Note (Signed)
 MAU Triage Note:  .Olivia Vasquez is a 23 y.o. at Unknown here in MAU reporting: started passing small to medium blood clots last Friday along with light bleeding - changing pads 2-3 times per day. She is also having lower abdominal and left sided cramps and sharp pain. Denies recent IC.   Patient complaint: Blood clots, stomach pains and cramps  Pain Score: 6  Pain Location: Abdomen     Onset of complaint: on-going LMP: Patient's last menstrual period was 02/17/2024 (exact date).  Vitals:   04/06/24 1925  BP: 97/71  Pulse: 62  Resp: 16  Temp: 98.3 F (36.8 C)  SpO2: 100%     Lab orders placed from triage: UPT POSITIVE

## 2024-04-06 NOTE — MAU Provider Note (Signed)
 VB  S Ms. Olivia Vasquez is a 23 y.o. G1P0000 pregnant female at [redacted]w[redacted]d who presents to MAU today with complaint of VB.   She started to pass small to medium-sized clots since Friday. Changes pad 2-3x per day. Having lower abdominal cramping and left-sided pain. Has not had an ultrasound this pregnancy. Denies LOF, fever/chills, change in discharge, urinary symptoms. Has OB intake scheduled with [facility]  Pertinent items noted in HPI and remainder of comprehensive ROS otherwise negative.   O BP 97/71 (BP Location: Right Arm)   Pulse 62   Temp 98.3 F (36.8 C) (Oral)   Resp 16   Wt 79.8 kg   LMP 02/17/2024 (Exact Date)   SpO2 100%   BMI 31.18 kg/m  Physical Exam Constitutional:      General: She is not in acute distress.    Appearance: She is not ill-appearing.  Cardiovascular:     Rate and Rhythm: Normal rate.  Pulmonary:     Effort: Pulmonary effort is normal.  Abdominal:     Palpations: Abdomen is soft.     Tenderness: There is no abdominal tenderness.  Skin:    General: Skin is warm and dry.  Neurological:     General: No focal deficit present.  Psychiatric:        Mood and Affect: Mood is anxious.    MDM: Ectopic work-up performed. MAU Course: Obtained CBC, quant, ABO, UA, wet prep, GC/C, transvaginal ultrasound for evaluation.  Results for orders placed or performed during the hospital encounter of 04/06/24 (from the past 24 hours)  Pregnancy, urine POC     Status: Abnormal   Collection Time: 04/06/24  7:32 PM  Result Value Ref Range   Preg Test, Ur POSITIVE (A) NEGATIVE  Urinalysis, Routine w reflex microscopic -Urine, Clean Catch     Status: Abnormal   Collection Time: 04/06/24  7:35 PM  Result Value Ref Range   Color, Urine YELLOW YELLOW   APPearance HAZY (A) CLEAR   Specific Gravity, Urine 1.032 (H) 1.005 - 1.030   pH 7.0 5.0 - 8.0   Glucose, UA NEGATIVE NEGATIVE mg/dL   Hgb urine dipstick MODERATE (A) NEGATIVE   Bilirubin Urine NEGATIVE  NEGATIVE   Ketones, ur NEGATIVE NEGATIVE mg/dL   Protein, ur 30 (A) NEGATIVE mg/dL   Nitrite NEGATIVE NEGATIVE   Leukocytes,Ua TRACE (A) NEGATIVE   RBC / HPF 21-50 0 - 5 RBC/hpf   WBC, UA 0-5 0 - 5 WBC/hpf   Bacteria, UA NONE SEEN NONE SEEN   Squamous Epithelial / HPF 0-5 0 - 5 /HPF   Mucus PRESENT    Amorphous Crystal PRESENT   CBC     Status: None   Collection Time: 04/06/24  8:12 PM  Result Value Ref Range   WBC 8.2 4.0 - 10.5 K/uL   RBC 3.87 3.87 - 5.11 MIL/uL   Hemoglobin 12.4 12.0 - 15.0 g/dL   HCT 62.4 63.9 - 53.9 %   MCV 96.9 80.0 - 100.0 fL   MCH 32.0 26.0 - 34.0 pg   MCHC 33.1 30.0 - 36.0 g/dL   RDW 87.0 88.4 - 84.4 %   Platelets 328 150 - 400 K/uL   nRBC 0.0 0.0 - 0.2 %  hCG, quantitative, pregnancy     Status: Abnormal   Collection Time: 04/06/24  8:12 PM  Result Value Ref Range   hCG, Beta Chain, Quant, S 9,737 (H) <5 mIU/mL  ABO/Rh     Status: None  Collection Time: 04/06/24  8:13 PM  Result Value Ref Range   ABO/RH(D)      B POS Performed at [facility], [address] [place], [place] [address]   Wet prep, genital     Status: Abnormal   Collection Time: 04/06/24  8:15 PM  Result Value Ref Range   Yeast Wet Prep HPF POC NONE SEEN NONE SEEN   Trich, Wet Prep NONE SEEN NONE SEEN   Clue Cells Wet Prep HPF POC NONE SEEN NONE SEEN   WBC, Wet Prep HPF POC >=10 (A) <10   Sperm NONE SEEN     EXAM: OBSTETRIC <14 WK ULTRASOUND   TECHNIQUE: Transabdominal ultrasound was performed for evaluation of the gestation as well as the maternal uterus and adnexal regions.   COMPARISON:  None Available.   FINDINGS: Intrauterine gestational sac: Single   Yolk sac:  Visualized.  Measures 5.8 mm   Embryo:  Visualized.   Cardiac Activity: Visualized.   Heart Rate: 127 bpm   CRL:   6.3 mm   6 w 3 d                  US  EDC: 11/27/2024   Subchorionic hemorrhage:  None visualized.   Maternal uterus/adnexae: Bilateral ovaries are visualized and  appear within normal limits. No pelvic free fluid.   IMPRESSION: 1. Single live intrauterine gestation measuring 6 weeks 3 days by crown-rump length. 2. The yolk sac is enlarged. This can be associated with increased risk of spontaneous miscarriage. Short-term follow-up ultrasound recommended.     Electronically Signed   By: Greig Pique M.D.   On: 04/06/2024 23:25  A Vaginal bleeding early pregnancy: Ectopic pregnancy ruled-out. Do suspect early miscarriage vs subchorionic hematoma unable to be visualized on ultrasound. Additionally no signs of UTI or vaginitis. Message sent to [facility] to obtain follow-up ultrasound in 10-14 for viability.  Medical screening exam complete  P Discharge from MAU in stable condition with miscarriage precautions Follow up at [facility] as scheduled for prenatal care  Allergies as of 04/06/2024   No Known Allergies      Medication List     STOP taking these medications    Blisovi  24 Fe 1-20 MG-MCG(24) tablet Generic drug: Norethindrone Acetate-Ethinyl Estrad-FE   fluconazole  150 MG tablet Commonly known as: DIFLUCAN    ibuprofen  800 MG tablet Commonly known as: ADVIL        TAKE these medications    Prenate Mini  18-0.6-0.4-350 MG Caps TAKE 1 CAPSULE BY MOUTH EVERYDAY AT BEDTIME        Nicholaus Almarie HERO, MD 04/07/2024 6:10 AM

## 2024-04-07 LAB — ABO/RH: ABO/RH(D): B POS

## 2024-04-14 ENCOUNTER — Other Ambulatory Visit (INDEPENDENT_AMBULATORY_CARE_PROVIDER_SITE_OTHER): Payer: Self-pay

## 2024-04-14 ENCOUNTER — Ambulatory Visit

## 2024-04-14 VITALS — BP 118/75 | HR 72 | Wt 178.0 lb

## 2024-04-14 DIAGNOSIS — O200 Threatened abortion: Secondary | ICD-10-CM | POA: Diagnosis not present

## 2024-04-14 DIAGNOSIS — Z3A01 Less than 8 weeks gestation of pregnancy: Secondary | ICD-10-CM

## 2024-04-14 DIAGNOSIS — Z3A08 8 weeks gestation of pregnancy: Secondary | ICD-10-CM

## 2024-04-14 DIAGNOSIS — O209 Hemorrhage in early pregnancy, unspecified: Secondary | ICD-10-CM

## 2024-04-14 DIAGNOSIS — O039 Complete or unspecified spontaneous abortion without complication: Secondary | ICD-10-CM

## 2024-04-14 NOTE — Progress Notes (Signed)
 Ultrasounds Results Note  SUBJECTIVE HPI:  Ms. Olivia Vasquez is a 23 y.o. G1P0000 at [redacted]w[redacted]d by LMP who presents to the  [facility] for followup ultrasound and results. The patient reports continued vaginal bleeding since 04/03/24.  Upon review of the patient's records, patient was first seen in MAU on 04/06/24 for vaginal bleeding in early pregnancy.   BHCG on that day was 9,737.  Ultrasound showed viable SIUP but with enlarged yolk sac.   Repeat ultrasound was performed today.  Reports having small amount of bleeding currently, no pain.   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   surgery at [facility] (left side only)   Past Surgical History:  Procedure Laterality Date   CARPAL TUNNEL RELEASE Right 02/13/2023   Procedure: RIGHT CARPAL TUNNEL RELEASE;  Surgeon: Jerri Kay HERO, MD;  Location: [facility];  Service: Orthopedics;  Laterality: Right;   CARPAL TUNNEL RELEASE Left 03/27/2023   Procedure: LEFT CARPAL TUNNEL RELEASE;  Surgeon: Jerri Kay HERO, MD;  Location: [facility];  Service: Orthopedics;  Laterality: Left;   SLIPPED CAPITAL FEMORAL EPIPHYSIS PINNING  06/2008   Pinned at [facility]   Social History   Socioeconomic History   Marital status: Single    Spouse name: Not on file   Number of children: 0   Years of education: Not on file   Highest education level: Not on file  Occupational History   Not on file  Tobacco Use   Smoking status: Never   Smokeless tobacco: Never  Vaping Use   Vaping status: Never Used  Substance and Sexual Activity   Alcohol use: Yes    Comment: socially   Drug use: No   Sexual activity: Yes    Partners: Male    Birth control/protection: Condom  Other Topics Concern   Not on file  Social History Narrative   Lives at home  with grandmother. .  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     Social Drivers of Corporate investment banker Strain: Not on file  Food Insecurity: Not on file  Transportation Needs: Not on file  Physical Activity: Not on file  Stress: Not on file  Social Connections: Not on file  Intimate Partner Violence: Not on file   Current Outpatient Medications on File Prior to Visit  Medication Sig Dispense Refill   Prenat-FeCbn-FeAsp-Meth-FA-DHA (PRENATE MINI ) 18-0.6-0.4-350 MG CAPS TAKE 1 CAPSULE BY MOUTH EVERYDAY AT BEDTIME 30 capsule 11   No current facility-administered medications on file prior to visit.   No Known Allergies  I have reviewed patient's Past Medical Hx, Surgical Hx, Family Hx, Social Hx, medications and allergies.   Review of Systems Review of Systems  Constitutional: Negative for fever and chills.  Gastrointestinal: Negative for nausea, vomiting, abdominal pain, diarrhea and constipation.  Genitourinary: Negative for dysuria.  Musculoskeletal: Negative for back pain.  Neurological: Negative for dizziness and weakness.    Physical Exam  BP 118/75   Pulse 72   Wt 178 lb (80.7 kg)   LMP 02/17/2024 (Exact Date)   BMI 31.53 kg/m   GENERAL: Well-developed, well-nourished female in no acute distress.  HEENT: Normocephalic, atraumatic.   LUNGS: Effort normal ABDOMEN: soft, non-tender HEART: Regular rate  SKIN: Warm, dry and without erythema  PSYCH: Normal mood and affect NEURO: Alert and oriented x 4  LAB RESULTS Results for orders placed or performed during the hospital encounter of 04/06/24 (from the past 2 weeks)  Pregnancy, urine POC   Collection Time: 04/06/24  7:32 PM  Result Value Ref Range   Preg Test, Ur POSITIVE (A) NEGATIVE  Urinalysis, Routine w reflex microscopic -Urine, Clean Catch   Collection Time: 04/06/24  7:35 PM  Result Value Ref Range   Color, Urine YELLOW YELLOW   APPearance HAZY (A) CLEAR   Specific Gravity, Urine 1.032 (H) 1.005 - 1.030    pH 7.0 5.0 - 8.0   Glucose, UA NEGATIVE NEGATIVE mg/dL   Hgb urine dipstick MODERATE (A) NEGATIVE   Bilirubin Urine NEGATIVE NEGATIVE   Ketones, ur NEGATIVE NEGATIVE mg/dL   Protein, ur 30 (A) NEGATIVE mg/dL   Nitrite NEGATIVE NEGATIVE   Leukocytes,Ua TRACE (A) NEGATIVE   RBC / HPF 21-50 0 - 5 RBC/hpf   WBC, UA 0-5 0 - 5 WBC/hpf   Bacteria, UA NONE SEEN NONE SEEN   Squamous Epithelial / HPF 0-5 0 - 5 /HPF   Mucus PRESENT    Amorphous Crystal PRESENT   CBC   Collection Time: 04/06/24  8:12 PM  Result Value Ref Range   WBC 8.2 4.0 - 10.5 K/uL   RBC 3.87 3.87 - 5.11 MIL/uL   Hemoglobin 12.4 12.0 - 15.0 g/dL   HCT 62.4 63.9 - 53.9 %   MCV 96.9 80.0 - 100.0 fL   MCH 32.0 26.0 - 34.0 pg   MCHC 33.1 30.0 - 36.0 g/dL   RDW 87.0 88.4 - 84.4 %   Platelets 328 150 - 400 K/uL   nRBC 0.0 0.0 - 0.2 %  hCG, quantitative, pregnancy   Collection Time: 04/06/24  8:12 PM  Result Value Ref Range   hCG, Beta Chain, Quant, S 9,737 (H) <5 mIU/mL  ABO/Rh   Collection Time: 04/06/24  8:13 PM  Result Value Ref Range   ABO/RH(D) B POS    No rh immune globuloin      NOT A RH IMMUNE GLOBULIN CANDIDATE, PT RH POSITIVE Performed at [facility], [address] [place], [place] [address]   Wet prep, genital   Collection Time: 04/06/24  8:15 PM  Result Value Ref Range   Yeast Wet Prep HPF POC NONE SEEN NONE SEEN   Trich, Wet Prep NONE SEEN NONE SEEN   Clue Cells Wet Prep HPF POC NONE SEEN NONE SEEN   WBC, Wet Prep HPF POC >=10 (A) <10   Sperm NONE SEEN     IMAGING US  OB LESS THAN 14 WEEKS WITH OB TRANSVAGINAL Result Date: 04/06/2024 CLINICAL DATA:  Vaginal bleeding EXAM: OBSTETRIC <14 WK ULTRASOUND TECHNIQUE: Transabdominal ultrasound was performed for evaluation of the gestation as well as the maternal uterus and adnexal regions. COMPARISON:  None Available. FINDINGS: Intrauterine gestational sac: Single Yolk sac:  Visualized.  Measures 5.8 mm Embryo:  Visualized. Cardiac Activity:  Visualized. Heart Rate: 127 bpm CRL:   6.3 mm   6 w 3 d                  US  EDC: 11/27/2024 Subchorionic hemorrhage:  None visualized. Maternal uterus/adnexae: Bilateral ovaries are visualized and appear within normal limits. No pelvic free fluid. IMPRESSION: 1. Single live intrauterine gestation measuring 6 weeks 3 days by crown-rump length. 2. The yolk sac is enlarged. This can be associated with increased risk of spontaneous  miscarriage. Short-term follow-up ultrasound recommended. Electronically Signed   By: Greig Pique M.D.   On: 04/06/2024 23:25   04/14/24 Preliminary read: No IUP seen today; no FP/YS/GS.  Possible blood clot/products of conception measuring 0.7 cm seen in endometrial canal, no flow noted.    ASSESSMENT 1. Miscarriage   2. Vaginal bleeding affecting early pregnancy     PLAN Discussed diagnosis of miscarriage with patient, support given to her. She declined any support resources from us , feels she has adequate resources at home. Discussed management options; expectant management vs misoprostol for the remaining 7 mm possible POC. Risks and benefits discussed, she desires expectant management for now.  Will reevaluate in two weeks. Patient understands indications that should necessitate going back to MAU/ER.  Herchel Gloris LABOR, MD  04/14/2024  11:55 AM

## 2024-04-14 NOTE — Progress Notes (Signed)
 Bleeding started a week before she went to MAU on 8/11, continued to bleed after and it got heavier with cramping and clots. Saturday is has slowed down, but is still bleeding.    Patient informed that the ultrasound is considered a limited obstetric ultrasound and is not intended to be a complete ultrasound exam.  Patient also informed that the ultrasound is not being completed with the intent of assessing for fetal or placental anomalies or any pelvic abnormalities. Explained that the purpose of today's ultrasound is to assess for fetal heart rate.  Patient acknowledges the purpose of the exam and the limitations of the study.        Wanda Buckles, RN

## 2024-04-30 ENCOUNTER — Encounter: Payer: Self-pay | Admitting: Obstetrics & Gynecology

## 2024-04-30 ENCOUNTER — Encounter: Admitting: Obstetrics & Gynecology

## 2024-04-30 ENCOUNTER — Ambulatory Visit: Admitting: Obstetrics & Gynecology

## 2024-04-30 VITALS — BP 111/71 | HR 80 | Wt 180.0 lb

## 2024-04-30 DIAGNOSIS — Z3A01 Less than 8 weeks gestation of pregnancy: Secondary | ICD-10-CM | POA: Diagnosis not present

## 2024-04-30 DIAGNOSIS — O039 Complete or unspecified spontaneous abortion without complication: Secondary | ICD-10-CM

## 2024-04-30 DIAGNOSIS — O021 Missed abortion: Secondary | ICD-10-CM | POA: Diagnosis not present

## 2024-04-30 DIAGNOSIS — N939 Abnormal uterine and vaginal bleeding, unspecified: Secondary | ICD-10-CM

## 2024-04-30 NOTE — Progress Notes (Signed)
 GYNECOLOGY OFFICE VISIT NOTE  History:  Olivia Vasquez is a 23 y.o. G1P0000 here today for follow up visit after diagnosis of miscarriage.  Reports still having episodes of moderate bleeding, wants to make sure everything is gone from her uterus.  Feels very tired, has been bleeding for one month.  She denies any  pelvic pain or other concerns.  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   surgery at [facility] (left side only)    Past Surgical History:  Procedure Laterality Date   CARPAL TUNNEL RELEASE Right 02/13/2023   Procedure: RIGHT CARPAL TUNNEL RELEASE;  Surgeon: Jerri Kay HERO, MD;  Location: [facility];  Service: Orthopedics;  Laterality: Right;   CARPAL TUNNEL RELEASE Left 03/27/2023   Procedure: LEFT CARPAL TUNNEL RELEASE;  Surgeon: Jerri Kay HERO, MD;  Location: [facility];  Service: Orthopedics;  Laterality: Left;   SLIPPED CAPITAL FEMORAL EPIPHYSIS PINNING  06/2008   Pinned at [facility]    The following portions of the patient's history were reviewed and updated as appropriate: allergies, current medications, past family history, past medical history, past social history, past surgical history and problem list.   Health Maintenance:  Normal pap and positive HRHPV on 07/29/2023.    Review of Systems:  Pertinent items noted in HPI and remainder of comprehensive ROS otherwise negative.  Physical Exam:  BP 111/71   Pulse 80   Wt 180 lb (81.6 kg)   LMP 02/17/2024 (Exact Date)   BMI 31.89 kg/m  CONSTITUTIONAL: Well-developed, well-nourished female in no acute distress.  HEENT:  Normocephalic, atraumatic. External right and left ear normal. No scleral icterus.  NECK: Normal range of motion, supple, no masses noted on observation SKIN: No rash noted. Not diaphoretic.  No erythema. No pallor. MUSCULOSKELETAL: Normal range of motion. No edema noted. NEUROLOGIC: Alert and oriented to person, place, and time. Normal muscle tone coordination. No cranial nerve deficit noted on observation. PSYCHIATRIC: Normal mood and affect. Normal behavior. Normal judgment and thought content. CARDIOVASCULAR: Normal heart rate noted RESPIRATORY: Effort and breath sounds normal, no problems with respiration noted ABDOMEN: No masses or other overt distention noted on observation. No tenderness.   PELVIC: Deferred  Labs and Imaging No results found for this or any previous visit (from the past week). US  OB Limited Result Date: 04/22/2024 ----------------------------------------------------------------------  OBSTETRICS REPORT                       (Signed Final 04/22/2024 08:05 am) ---------------------------------------------------------------------- Patient Info  ID #:       [id number]                          D.O.B.:  2001-05-26 (23 yrs)(F)  Name:       Olivia Vasquez               Visit Date: 04/14/2024 11:22 am ---------------------------------------------------------------------- Performed By  Attending:        Gloris Hugger         Referred By:      [facility] MAU/Triage                    MD  Performed By:     Wanda Buckles RN  Location:         [facility]                                                             [facility] ---------------------------------------------------------------------- Orders  #  Description                           Code        Ordered By  1  US  OB LIMITED                         23184.9     GLORIS HUGGER ----------------------------------------------------------------------  #  Order #                     Accession #                Episode #  1  [id number]                   [id number]                 [id number]  ---------------------------------------------------------------------- Indications  Bleeding in early pregnancy                    O20.9 ---------------------------------------------------------------------- Fetal Evaluation  Num Of Fetuses:         1  Cardiac Activity:       No embryo visualized ---------------------------------------------------------------------- OB History  Gravidity:    1         Term:   0        Prem:   0        SAB:   0  TOP:          0       Ectopic:  0        Living: 0 ---------------------------------------------------------------------- Impression  No IUP seen today; no FP/YS/GS. Possible blood  clot/products of conception measuring 0.7 cm seen in  endometrial canal, no flow noted. ---------------------------------------------------------------------- Recommendations  Miscarriage diagnosis discussed with patient, please refer to  note for further details. ----------------------------------------------------------------------              GLORIS HUGGER, MD Electronically Signed Final Report   04/22/2024 08:05 am ----------------------------------------------------------------------   US  OB LESS THAN 14 WEEKS WITH OB TRANSVAGINAL Result Date: 04/06/2024 CLINICAL DATA:  Vaginal bleeding EXAM: OBSTETRIC <14 WK ULTRASOUND TECHNIQUE: Transabdominal ultrasound was performed for evaluation of the gestation as well as the maternal uterus and adnexal regions.  COMPARISON:  None Available. FINDINGS: Intrauterine gestational sac: Single Yolk sac:  Visualized.  Measures 5.8 mm Embryo:  Visualized. Cardiac Activity: Visualized. Heart Rate: 127 bpm CRL:   6.3 mm   6 w 3 d                  US  EDC: 11/27/2024 Subchorionic hemorrhage:  None visualized. Maternal uterus/adnexae: Bilateral ovaries are visualized and appear within normal limits. No pelvic free fluid. IMPRESSION: 1. Single live intrauterine gestation measuring 6 weeks 3 days by crown-rump length. 2. The yolk sac is enlarged. This can be  associated with increased risk of spontaneous miscarriage. Short-term follow-up ultrasound recommended. Electronically Signed   By: Greig Pique M.D.   On: 04/06/2024 23:25    Assessment and Plan:     1. Abnormal uterine bleeding 2. Follow-up visit after miscarriage (Primary) Will check CBC to evaluate for anemia, HCG level given prolonged bleeding.  U/S also ordered ASAP, will follow up results and manage accordingly.  Bleeding precautions reviewed. - Beta hCG quant (ref lab) - CBC - US  PELVIC COMPLETE WITH TRANSVAGINAL; Future  Please refer to After Visit Summary for other counseling recommendations.   Return for follow up as recommended.    I spent 25 minutes dedicated to the care of this patient including pre-visit review of records, face to face time with the patient discussing her conditions and treatments, post visit ordering of medications and appropriate tests or procedures, coordinating care and documenting this visit encounter.    GLORIS HUGGER, MD, FACOG Obstetrician & Gynecologist, [facility] [employer], [facility]

## 2024-04-30 NOTE — Progress Notes (Signed)
 Follow up SAB   Pt stating that she is still having bleeding and cramping light to moderate

## 2024-05-01 ENCOUNTER — Encounter: Payer: Self-pay | Admitting: *Deleted

## 2024-05-01 ENCOUNTER — Encounter: Payer: Self-pay | Admitting: Obstetrics & Gynecology

## 2024-05-01 LAB — CBC
Hematocrit: 36.7 % (ref 34.0–46.6)
Hemoglobin: 12 g/dL (ref 11.1–15.9)
MCH: 31.8 pg (ref 26.6–33.0)
MCHC: 32.7 g/dL (ref 31.5–35.7)
MCV: 97 fL (ref 79–97)
Platelets: 295 x10E3/uL (ref 150–450)
RBC: 3.77 x10E6/uL (ref 3.77–5.28)
RDW: 12.3 % (ref 11.7–15.4)
WBC: 3.8 x10E3/uL (ref 3.4–10.8)

## 2024-05-01 LAB — BETA HCG QUANT (REF LAB): hCG Quant: 6 m[IU]/mL

## 2024-05-04 ENCOUNTER — Ambulatory Visit
Admission: RE | Admit: 2024-05-04 | Discharge: 2024-05-04 | Disposition: A | Discharge status: Home / Self Care | Source: Ambulatory Visit | Attending: Obstetrics & Gynecology | Admitting: Obstetrics & Gynecology

## 2024-05-04 ENCOUNTER — Ambulatory Visit: Payer: Self-pay | Admitting: Obstetrics & Gynecology

## 2024-05-04 ENCOUNTER — Other Ambulatory Visit: Payer: Self-pay | Admitting: Obstetrics & Gynecology

## 2024-05-04 DIAGNOSIS — O039 Complete or unspecified spontaneous abortion without complication: Secondary | ICD-10-CM

## 2024-05-04 DIAGNOSIS — N939 Abnormal uterine and vaginal bleeding, unspecified: Secondary | ICD-10-CM

## 2024-05-04 DIAGNOSIS — Z5189 Encounter for other specified aftercare: Secondary | ICD-10-CM | POA: Insufficient documentation

## 2024-05-12 ENCOUNTER — Encounter: Payer: Self-pay | Admitting: Obstetrics & Gynecology

## 2024-05-12 DIAGNOSIS — Z3040 Encounter for surveillance of contraceptives, unspecified: Secondary | ICD-10-CM

## 2024-05-13 ENCOUNTER — Other Ambulatory Visit: Payer: Self-pay | Admitting: *Deleted

## 2024-05-13 DIAGNOSIS — Z3040 Encounter for surveillance of contraceptives, unspecified: Secondary | ICD-10-CM

## 2024-05-13 MED ORDER — BLISOVI 24 FE 1-20 MG-MCG(24) PO TABS: 1.0000 | ORAL_TABLET | Freq: Every day | ORAL | 11 refills | Status: DC

## 2024-07-01 ENCOUNTER — Telehealth: Admitting: Family Medicine

## 2024-07-01 DIAGNOSIS — J069 Acute upper respiratory infection, unspecified: Secondary | ICD-10-CM | POA: Diagnosis not present

## 2024-07-01 MED ORDER — BENZONATATE 100 MG PO CAPS: 100.0000 mg | ORAL_CAPSULE | Freq: Three times a day (TID) | ORAL | 0 refills | Status: DC | PRN

## 2024-07-01 MED ORDER — FLUTICASONE PROPIONATE 50 MCG/ACT NA SUSP: 2.0000 | Freq: Every day | NASAL | 0 refills | Status: DC

## 2024-07-01 NOTE — Progress Notes (Signed)
 E visit for Flu like symptoms   We are sorry that you are not feeling well.  Here is how we plan to help! Based on what you have shared with me it looks like you may have flu-like symptoms that should be watched but do not seem to indicate anti-viral treatment.  Influenza or "the flu" is  an infection caused by a respiratory virus. The flu virus is highly contagious and persons who did not receive their yearly flu vaccination may "catch" the flu from close contact.  We have anti-viral medications to treat the viruses that cause this infection. They are not a "cure" and only shorten the course of the infection. These prescriptions are most effective when they are given within the first 2 days of "flu" symptoms. Antiviral medications are indicated if you have a high risk of complications from the flu. You should  also consider an antiviral medication if you are in close contact with someone who is at risk. These medications can help patients avoid complications from the flu but have side effects that you should know.   Possible side effects from Tamiflu or oseltamivir include nausea, vomiting, diarrhea, dizziness, headaches, eye redness, sleep problems or other respiratory symptoms. You should not take Tamiflu if you have an allergy to oseltamivir or any to the ingredients in Tamiflu.   For nasal congestion, you may use an oral decongestant such as Mucinex D or if you have glaucoma or high blood pressure use plain Mucinex.  Saline nasal spray or nasal drops can help and can safely be used as often as needed for congestion.  If you have a sore or scratchy throat, use a saltwater gargle-  to  teaspoon of salt dissolved in a 4-ounce to 8-ounce glass of warm water.  Gargle the solution for approximately 15-30 seconds and then spit.  It is important not to swallow the solution.  You can also use throat lozenges/cough drops and Chloraseptic spray to help with throat pain or discomfort.  Warm or cold liquids  can also be helpful in relieving throat pain.  For headache, pain or general discomfort, you can use Ibuprofen  or Tylenol  as directed.   Some authorities believe that zinc sprays or the use of Echinacea may shorten the course of your symptoms.  I have prescribed the following medications to help lessen symptoms: I have prescribed Tessalon  Perles 100 mg. You may take 1-2 capsules every 8 hours as needed for cough and I have prescribed Fluticasone  nasal spray 2 sprays in each nostril one time per dayasal spray 2 sprays in each nostril one time per day  You are to isolate at home until you have been fever-free for at least 24 hours without a fever-reducing medication, and symptoms have been steadily improving for 24 hours.  If you must be around other household members who do not have symptoms, you need to make sure that both you and the family members are masking consistently with a high-quality mask.  If you note any worsening of symptoms despite treatment, please seek an in-person evaluation ASAP. If you note any significant shortness of breath or any chest pain, please seek ED evaluation. Please do not delay care!  ANYONE WHO HAS FLU SYMPTOMS SHOULD: Stay home. The flu is highly contagious and going out or to work exposes others! Be sure to drink plenty of fluids. Water is fine as well as fruit juices, sodas and electrolyte beverages. You may want to stay away from caffeine or alcohol. If you  are nauseated, try taking small sips of liquids. How do you know if you are getting enough fluid? Your urine should be a pale yellow or almost colorless. Get rest. Taking a steamy shower or using a humidifier may help nasal congestion and ease sore throat pain. Using a saline nasal spray works much the same way. Cough drops, hard candies and sore throat lozenges may ease your cough. Line up a caregiver. Have someone check on you regularly.  GET HELP RIGHT AWAY IF: You cannot keep down liquids or your  medications. You become short of breath Your fell like you are going to pass out or loose consciousness. Your symptoms persist after you have completed your treatment plan  MAKE SURE YOU  Understand these instructions. Will watch your condition. Will get help right away if you are not doing well or get worse.  Your e-visit answers were reviewed by a board certified advanced clinical practitioner to complete your personal care plan.  Depending on the condition, your plan could have included both over the counter or prescription medications.  If there is a problem please reply  once you have received a response from your provider.  Your safety is important to us .  If you have drug allergies check your prescription carefully.    You can use MyChart to ask questions about today's visit, request a non-urgent call back, or ask for a work or school excuse for 24 hours related to this e-Visit. If it has been greater than 24 hours you will need to follow up with your provider, or enter a new e-Visit to address those concerns.  You will get an e-mail in the next two days asking about your experience.  I hope that your e-visit has been valuable and will speed your recovery. Thank you for using e-visits.   I have spent 5 minutes in review of e-visit questionnaire, review and updating patient chart, medical decision making and response to patient.   Chiquita CHRISTELLA Barefoot, NP

## 2024-09-09 ENCOUNTER — Encounter: Payer: Self-pay | Admitting: *Deleted

## 2024-09-10 ENCOUNTER — Other Ambulatory Visit (INDEPENDENT_AMBULATORY_CARE_PROVIDER_SITE_OTHER): Payer: Self-pay

## 2024-09-10 ENCOUNTER — Ambulatory Visit (INDEPENDENT_AMBULATORY_CARE_PROVIDER_SITE_OTHER): Admitting: *Deleted

## 2024-09-10 VITALS — BP 127/75 | HR 83 | Wt 197.0 lb

## 2024-09-10 DIAGNOSIS — Z3481 Encounter for supervision of other normal pregnancy, first trimester: Secondary | ICD-10-CM

## 2024-09-10 DIAGNOSIS — O99210 Obesity complicating pregnancy, unspecified trimester: Secondary | ICD-10-CM | POA: Insufficient documentation

## 2024-09-10 DIAGNOSIS — Z3A12 12 weeks gestation of pregnancy: Secondary | ICD-10-CM

## 2024-09-10 DIAGNOSIS — O3680X0 Pregnancy with inconclusive fetal viability, not applicable or unspecified: Secondary | ICD-10-CM

## 2024-09-10 DIAGNOSIS — O99212 Obesity complicating pregnancy, second trimester: Secondary | ICD-10-CM | POA: Diagnosis not present

## 2024-09-10 DIAGNOSIS — Z3480 Encounter for supervision of other normal pregnancy, unspecified trimester: Secondary | ICD-10-CM | POA: Insufficient documentation

## 2024-09-10 NOTE — Progress Notes (Signed)
 New OB Intake  I explained I am completing New OB Intake today. We discussed EDD of 03/23/2025, by Last Menstrual Period. Pt is G2P0010. I reviewed her allergies, medications and Medical/Surgical/OB history.    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  Patient also informed that the ultrasound is not being completed with the intent of assessing for fetal or placental anomalies or any pelvic abnormalities. Explained that the purpose of today's ultrasound is to assess for viability.  Patient acknowledges the purpose of the exam and the limitations of the study.     Delivery Plans Plans to deliver at [facility]. Discussed the nature of our practice with multiple providers including residents and students. Due to the size of the practice, the delivering provider may not be the same as those providing prenatal care.   MyChart/Babyscripts MyChart access verified. I explained pt will have some visits in office and some virtually. Babyscripts app discussed and ordered.   Blood Pressure Cuff Blood pressure cuff discussed and has one at home.Discussed to be used for virtual visits and or if needed BP checks weekly.  Anatomy US  Explained first scheduled US  will be around 19 weeks.   Last Pap Diagnosis  Date Value Ref Range Status  02/05/2024 - Low grade  squamous intraepithelial lesion (LSIL) (A)  Final    First visit review I reviewed new OB appt with patient. Explained pt will be seen by Dr Izell at first visit. Discussed Jennell genetic screening with patient collected today. Routine prenatal labs collected today.    Wanda Buckles, RN 09/10/2024  4:13 PM

## 2024-09-11 ENCOUNTER — Ambulatory Visit: Payer: Self-pay | Admitting: Family Medicine

## 2024-09-11 DIAGNOSIS — Z3480 Encounter for supervision of other normal pregnancy, unspecified trimester: Secondary | ICD-10-CM

## 2024-09-11 LAB — CBC/D/PLT+RPR+RH+ABO+RUBIGG...
Antibody Screen: NEGATIVE
Basophils Absolute: 0 x10E3/uL (ref 0.0–0.2)
Basos: 0 %
EOS (ABSOLUTE): 0.1 x10E3/uL (ref 0.0–0.4)
Eos: 1 %
HCV Ab: NONREACTIVE
HIV Screen 4th Generation wRfx: NONREACTIVE
Hematocrit: 37.9 % (ref 34.0–46.6)
Hemoglobin: 12.8 g/dL (ref 11.1–15.9)
Hepatitis B Surface Ag: NEGATIVE
Immature Grans (Abs): 0 x10E3/uL (ref 0.0–0.1)
Immature Granulocytes: 0 %
Lymphocytes Absolute: 0.2 x10E3/uL — ABNORMAL LOW (ref 0.7–3.1)
Lymphs: 3 %
MCH: 32.3 pg (ref 26.6–33.0)
MCHC: 33.8 g/dL (ref 31.5–35.7)
MCV: 96 fL (ref 79–97)
Monocytes Absolute: 0.8 x10E3/uL (ref 0.1–0.9)
Monocytes: 10 %
Neutrophils Absolute: 7 x10E3/uL (ref 1.4–7.0)
Neutrophils: 85 %
Platelets: 321 x10E3/uL (ref 150–450)
RBC: 3.96 x10E6/uL (ref 3.77–5.28)
RDW: 13.3 % (ref 11.7–15.4)
RPR Ser Ql: NONREACTIVE
Rh Factor: POSITIVE
Rubella Antibodies, IGG: 1.26 {index}
WBC: 8.2 x10E3/uL (ref 3.4–10.8)

## 2024-09-11 LAB — CULTURE, OB URINE

## 2024-09-11 LAB — HEMOGLOBIN A1C
Est. average glucose Bld gHb Est-mCnc: 88 mg/dL
Hgb A1c MFr Bld: 4.7 % — ABNORMAL LOW (ref 4.8–5.6)

## 2024-09-11 LAB — HCV INTERPRETATION

## 2024-09-11 LAB — URINE CULTURE, OB REFLEX: Organism ID, Bacteria: NO GROWTH

## 2024-09-16 ENCOUNTER — Encounter: Payer: Self-pay | Admitting: Obstetrics & Gynecology

## 2024-09-17 ENCOUNTER — Other Ambulatory Visit (HOSPITAL_COMMUNITY)
Admission: RE | Admit: 2024-09-17 | Discharge: 2024-09-17 | Disposition: A | Source: Ambulatory Visit | Attending: Obstetrics and Gynecology | Admitting: Obstetrics and Gynecology

## 2024-09-17 ENCOUNTER — Telehealth: Admitting: Physician Assistant

## 2024-09-17 ENCOUNTER — Ambulatory Visit: Admitting: Obstetrics and Gynecology

## 2024-09-17 VITALS — BP 114/74 | HR 88 | Wt 195.8 lb

## 2024-09-17 DIAGNOSIS — Z3480 Encounter for supervision of other normal pregnancy, unspecified trimester: Secondary | ICD-10-CM

## 2024-09-17 DIAGNOSIS — J069 Acute upper respiratory infection, unspecified: Secondary | ICD-10-CM

## 2024-09-17 DIAGNOSIS — Z3481 Encounter for supervision of other normal pregnancy, first trimester: Secondary | ICD-10-CM | POA: Diagnosis not present

## 2024-09-17 DIAGNOSIS — Z131 Encounter for screening for diabetes mellitus: Secondary | ICD-10-CM

## 2024-09-17 DIAGNOSIS — Z3A13 13 weeks gestation of pregnancy: Secondary | ICD-10-CM

## 2024-09-17 DIAGNOSIS — N870 Mild cervical dysplasia: Secondary | ICD-10-CM | POA: Diagnosis not present

## 2024-09-17 MED ORDER — LIDOCAINE VISCOUS HCL 2 % MT SOLN: 5.0000 mL | Freq: Four times a day (QID) | OROMUCOSAL | 0 refills | Status: AC | PRN

## 2024-09-17 MED ORDER — FLUTICASONE PROPIONATE 50 MCG/ACT NA SUSP: 2.0000 | Freq: Every day | NASAL | 0 refills | Status: AC

## 2024-09-17 NOTE — Progress Notes (Signed)
 New OB Note  09/17/2024   Clinic: [facility] for [facility]  Chief Complaint: new OB  Transfer of Care Patient: no  History of Present Illness: Olivia Vasquez is a 24 y.o. G2P0010 at 13/2 weeks (EDC 7/28, based on Patient's last menstrual period was 06/16/2024.=12wk u/s)  Pregnancy complicated by has Allergic rhinitis; Asthma, mild intermittent; s/p surgery for left SCFE; GERD (gastroesophageal reflux disease); Carpal tunnel syndrome on right; Carpal tunnel syndrome on left; LGSIL on Pap smear of cervix with positive HRHPV on 02/05/2024; Mild dysplasia of cervix (CIN I); Supervision of other normal pregnancy, antepartum; and Obesity affecting pregnancy on their problem list.   No OB s/s.  ROS: A 12-point review of systems was performed and negative, except as stated in the above HPI.  OBGYN History: As per HPI. 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   surgery at [facility] (left side only)   Past Surgical History: Past Surgical History:  Procedure Laterality Date   CARPAL TUNNEL RELEASE Right 02/13/2023   Procedure: RIGHT CARPAL TUNNEL RELEASE;  Surgeon: Jerri Kay HERO, MD;  Location: [facility];  Service: Orthopedics;  Laterality: Right;   CARPAL TUNNEL RELEASE Left 03/27/2023   Procedure: LEFT CARPAL TUNNEL RELEASE;  Surgeon: Jerri Kay HERO, MD;  Location: [facility];  Service: Orthopedics;  Laterality: Left;   SLIPPED CAPITAL FEMORAL EPIPHYSIS PINNING  06/2008   Pinned at [facility]   Family History:  Family History   Problem Relation Age of Onset   Diabetes Paternal Grandmother    Hypertension Paternal Grandmother    Kidney disease Maternal Uncle        cancer   Kidney disease Maternal Grandmother        cancer   Hypertension Maternal Grandmother    Thyroid  disease Maternal Grandmother    Cancer Maternal Grandfather        fatty liver.   Social History:  Social History   Socioeconomic History   Marital status: Single    Spouse name: Not on file   Number of children: 0   Years of education: Not on file   Highest education level: Not on file  Occupational History   Not on file  Tobacco Use   Smoking status: Never   Smokeless tobacco: Never  Vaping Use   Vaping status: Never Used  Substance and Sexual Activity   Alcohol use: Not Currently    Comment: socially   Drug use: No   Sexual activity: Yes    Partners: Male    Birth control/protection: Condom  Other Topics Concern   Not on file  Social History Narrative   Lives at home with grandmother. .  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     Social Drivers of Health   Tobacco Use: Low Risk (09/10/2024)   Patient History    Smoking Tobacco Use: Never  Smokeless Tobacco Use: Never    Passive Exposure: Not on file  Financial Resource Strain: Not on file  Food Insecurity: Not on file  Transportation Needs: Not on file  Physical Activity: Not on file  Stress: Not on file  Social Connections: Not on file  Intimate Partner Violence: Not on file  Depression (PHQ2-9): High Risk (11/28/2022)   Depression (PHQ2-9)    PHQ-2 Score: 18  Alcohol Screen: Not on file  Housing: Not on file  Utilities: Not on file  Health Literacy: Not on file   Allergy: Allergies[1]  Current Outpatient Medications: Prenatal vitamin  Physical Exam:   BP 114/74   Pulse 88   Wt 195 lb 12.8 oz (88.8 kg)   LMP 06/16/2024   BMI 34.68 kg/m  Body mass index is 34.68 kg/m. Contractions: Not present Vag. Bleeding: None. Fundal height: not applicable FHTs:  150s  General appearance: Well nourished, well developed female in no acute distress.  Neck:  Supple, normal appearance, and no thyromegaly  Respiratory:   Normal respiratory effort Abdomen: soft, nttp, nd Neuro/Psych:  Normal mood and affect.  Skin:  Warm and dry.   Laboratory: reviewed  Imaging:  As per HPI  Assessment: patient doing well  Plan: 1. Supervision of other normal pregnancy, antepartum (Primary) Routine care Follow up anatomy u/s - Cervicovaginal ancillary only( [place])  2. Screening for diabetes mellitus Early A1c negative  3. [redacted] weeks gestation of pregnancy  4. Mild dysplasia of cervix (CIN I) Due for repeat pap July 2026  Problem list reviewed and updated.  Follow up in 4 weeks.  >50% of 35 min visit spent on counseling and coordination of care.  Return in about 1 month (around 10/18/2024) for in person, low risk ob, md or app. Future Appointments  Date Time Provider Department Center  10/20/2024  3:30 PM Fredirick Glenys RAMAN, MD [facility]  10/28/2024 11:00 AM [facility] PROVIDER 1 [facility]  10/28/2024 11:30 AM [facility] US4 [facility]   Olivia Izell Overcast MD Attending [facility])     [1] No Known Allergies

## 2024-09-17 NOTE — Progress Notes (Signed)
 We are sorry you are not feeling well.  Here is how we plan to help!  Based on what you have shared with me, it looks like you may have a viral upper respiratory infection.  Upper respiratory infections are caused by a large number of viruses; however, rhinovirus is the most common cause.   Symptoms vary from person to person, with common symptoms including sore throat, cough, and fatigue or lack of energy, and a feeling of general discomfort.  A low-grade fever of up to 100.4 may present, but is often uncommon.  Symptoms vary however, and are closely related to a person's age or underlying illnesses.  The most common symptoms associated with an upper respiratory infection are nasal discharge or congestion, cough, sneezing, headache and pressure in the ears and face.  These symptoms usually persist for about 3 to 10 days, but can last up to 2 weeks.  It is important to know that upper respiratory infections do not cause serious illness or complications in most cases.    Upper respiratory infections can be transmitted from person to person, with the most common method of transmission being a person's hands.  The virus is able to live on the skin and can infect other persons for up to 2 hours after direct contact.  Also, these can be transmitted when someone coughs or sneezes; thus, it is important to cover the mouth to reduce this risk.  To keep the spread of the illness at bay, good hand hygiene is very important!  Because this is a viral infection, there are no specific treatments other than to help you with the symptoms until the infection runs its course.    For nasal congestion, you may use an oral decongestants such as Mucinex D or if you have glaucoma or high blood pressure use plain Mucinex.  Saline nasal spray or nasal drops can help and can safely be used as often as needed for congestion.  For your congestion, I have prescribed Fluticasone  nasal spray one spray in each nostril twice a day  If  you do not have a history of heart disease, hypertension, diabetes or thyroid  disease, prostate/bladder issues or glaucoma, you may also use Sudafed to treat nasal congestion.  It is highly recommended that you consult with a pharmacist or your primary care physician to ensure this medication is safe for you to take.     If you have a cough, you may use over-the-counter cough suppressants such as Delsym and Robitussin.  If you have glaucoma or high blood pressure, you can also use Coricidin HBP.  Safest medications for cough during pregnancy are the over-the-counter medications (I will attach a common medications safe in pregnancy following.)  If you have a sore or scratchy throat, use a saltwater gargle-  to  teaspoon of salt dissolved in a 4-ounce to 8-ounce glass of warm water.  Gargle the solution for approximately 15-30 seconds and then spit.  It is important not to swallow the solution.  You can also use throat lozenges/cough drops and Chloraseptic spray to help with throat pain or discomfort.  Warm or cold liquids can also be helpful in relieving throat pain. I have also prescribed I have prescribed a Viscous Lidocaine  2% solution. Swallow 5-10 mL every 4-6 hours as needed for sore throat. DO NOT eat or drink anything for 15-20 minutes after swallowing to allow the medication to coat the throat.  For headache, pain, or general discomfort, you can use Ibuprofen  or Tylenol   as directed.   Some authorities believe that zinc sprays or the use of Echinacea may shorten the course of your symptoms.   HOME CARE Only take medications as instructed by your medical team. Be sure to drink plenty of fluids. Water is fine as well as fruit juices, sodas and electrolyte beverages. You may want to stay away from caffeine or alcohol. If you are nauseated, try taking small sips of liquids. How do you know if you are getting enough fluid? Your urine should be a pale yellow or almost colorless. Get rest. Taking a  steamy shower or using a humidifier may help nasal congestion and ease sore throat pain. You can place a towel over your head and breathe in the steam from hot water coming from a faucet. Using a saline nasal spray works much the same way. Cough drops, hard candies and sore throat lozenges may ease your cough. Avoid close contacts especially the very young and the elderly Cover your mouth if you cough or sneeze Always remember to wash your hands.   GET HELP RIGHT AWAY IF: You develop worsening fever. If your symptoms do not improve within 10 days You develop yellow or green discharge from your nose over 3 days. You have coughing fits You develop a severe head ache or visual changes. You develop shortness of breath, difficulty breathing or start having chest pain Your symptoms persist after you have completed your treatment plan  MAKE SURE YOU  Understand these instructions. Will watch your condition. Will get help right away if you are not doing well or get worse.  Your e-visit answers were reviewed by a board certified advanced clinical practitioner to complete your personal care plan. Depending upon the condition, your plan could have included both over-the-counter or prescription medications.  Please review your pharmacy choice. If there is a problem, you may call our nursing hot line at and have the prescription routed to another pharmacy. Your safety is important to us . If you have drug allergies, check your prescription carefully.   You can use MyChart to ask questions about todays visit, request a non-urgent call back, or ask for a work or school excuse for 24 hours related to this e-Visit. If it has been greater than 24 hours you will need to follow up with your provider, or enter a new e-Visit to address those concerns. You will get an e-mail in the next two days asking about your experience.  I hope that your e-visit has been valuable and will speed your recovery. Thank you for  using e-visits.   I have spent 5 minutes in review of e-visit questionnaire, review and updating patient chart, medical decision making and response to patient.   Delon CHRISTELLA Dickinson, PA-C

## 2024-09-18 LAB — PANORAMA PRENATAL TEST FULL PANEL:PANORAMA TEST PLUS 5 ADDITIONAL MICRODELETIONS: FETAL FRACTION: 4.4

## 2024-09-18 LAB — CERVICOVAGINAL ANCILLARY ONLY
Chlamydia: NEGATIVE
Comment: NEGATIVE
Comment: NORMAL
Neisseria Gonorrhea: NEGATIVE

## 2024-09-19 LAB — HORIZON CUSTOM: REPORT SUMMARY: NEGATIVE

## 2024-10-06 ENCOUNTER — Encounter: Admitting: Obstetrics & Gynecology

## 2024-10-20 ENCOUNTER — Encounter: Admitting: Family Medicine

## 2024-10-28 ENCOUNTER — Ambulatory Visit

## 2024-10-28 ENCOUNTER — Other Ambulatory Visit
